# Patient Record
Sex: Female | Born: 1980 | Race: Black or African American | Hispanic: No | Marital: Single | Smoking: Current every day smoker
Health system: Southern US, Community
[De-identification: ages and names within clinical notes are randomized; demographics above are authoritative.]

## PROBLEM LIST (undated history)

## (undated) DIAGNOSIS — E079 Disorder of thyroid, unspecified: Secondary | ICD-10-CM

## (undated) DIAGNOSIS — M549 Dorsalgia, unspecified: Secondary | ICD-10-CM

## (undated) DIAGNOSIS — F329 Major depressive disorder, single episode, unspecified: Secondary | ICD-10-CM

## (undated) DIAGNOSIS — R87629 Unspecified abnormal cytological findings in specimens from vagina: Secondary | ICD-10-CM

## (undated) DIAGNOSIS — F32A Depression, unspecified: Secondary | ICD-10-CM

## (undated) DIAGNOSIS — D649 Anemia, unspecified: Secondary | ICD-10-CM

## (undated) DIAGNOSIS — N83209 Unspecified ovarian cyst, unspecified side: Secondary | ICD-10-CM

## (undated) DIAGNOSIS — F419 Anxiety disorder, unspecified: Secondary | ICD-10-CM

## (undated) HISTORY — DX: Unspecified abnormal cytological findings in specimens from vagina: R87.629

## (undated) HISTORY — DX: Disorder of thyroid, unspecified: E07.9

## (undated) HISTORY — DX: Anemia, unspecified: D64.9

## (undated) HISTORY — DX: Unspecified ovarian cyst, unspecified side: N83.209

## (undated) HISTORY — DX: Major depressive disorder, single episode, unspecified: F32.9

## (undated) HISTORY — DX: Depression, unspecified: F32.A

## (undated) HISTORY — PX: EXTERNAL EAR SURGERY: SHX627

## (undated) HISTORY — DX: Dorsalgia, unspecified: M54.9

## (undated) HISTORY — DX: Anxiety disorder, unspecified: F41.9

---

## 2008-09-03 ENCOUNTER — Ambulatory Visit (HOSPITAL_COMMUNITY): Admission: RE | Admit: 2008-09-03 | Discharge: 2008-09-03 | Payer: Self-pay | Admitting: Obstetrics

## 2008-12-08 ENCOUNTER — Ambulatory Visit (HOSPITAL_COMMUNITY): Admission: RE | Admit: 2008-12-08 | Discharge: 2008-12-08 | Payer: Self-pay | Admitting: Obstetrics

## 2009-01-31 ENCOUNTER — Inpatient Hospital Stay (HOSPITAL_COMMUNITY): Admission: RE | Admit: 2009-01-31 | Discharge: 2009-02-02 | Payer: Self-pay | Admitting: Obstetrics

## 2009-10-18 ENCOUNTER — Emergency Department (HOSPITAL_COMMUNITY): Admission: EM | Admit: 2009-10-18 | Discharge: 2009-10-18 | Payer: Self-pay | Admitting: Emergency Medicine

## 2010-04-09 LAB — CBC
HCT: 28.3 % — ABNORMAL LOW (ref 36.0–46.0)
MCHC: 32.8 g/dL (ref 30.0–36.0)
MCV: 89.8 fL (ref 78.0–100.0)
Platelets: 423 10*3/uL — ABNORMAL HIGH (ref 150–400)
Platelets: 461 10*3/uL — ABNORMAL HIGH (ref 150–400)
RBC: 3.46 MIL/uL — ABNORMAL LOW (ref 3.87–5.11)
RDW: 15.2 % (ref 11.5–15.5)
RDW: 15.3 % (ref 11.5–15.5)
WBC: 19.4 10*3/uL — ABNORMAL HIGH (ref 4.0–10.5)

## 2010-04-09 LAB — RPR: RPR Ser Ql: NONREACTIVE

## 2011-05-21 ENCOUNTER — Other Ambulatory Visit (HOSPITAL_COMMUNITY)
Admission: RE | Admit: 2011-05-21 | Discharge: 2011-05-21 | Disposition: A | Discharge status: Home / Self Care | Payer: Self-pay | Source: Ambulatory Visit | Attending: Family Medicine | Admitting: Family Medicine

## 2011-05-21 DIAGNOSIS — Z01419 Encounter for gynecological examination (general) (routine) without abnormal findings: Secondary | ICD-10-CM | POA: Insufficient documentation

## 2011-08-09 ENCOUNTER — Other Ambulatory Visit: Payer: Self-pay | Admitting: Family Medicine

## 2011-08-09 DIAGNOSIS — E041 Nontoxic single thyroid nodule: Secondary | ICD-10-CM

## 2011-08-09 DIAGNOSIS — E010 Iodine-deficiency related diffuse (endemic) goiter: Secondary | ICD-10-CM

## 2011-08-10 ENCOUNTER — Other Ambulatory Visit: Payer: Self-pay | Admitting: Family Medicine

## 2011-08-13 ENCOUNTER — Other Ambulatory Visit: Payer: Self-pay

## 2011-08-13 ENCOUNTER — Other Ambulatory Visit: Payer: Self-pay | Admitting: Family Medicine

## 2011-08-13 DIAGNOSIS — N6452 Nipple discharge: Secondary | ICD-10-CM

## 2011-08-14 ENCOUNTER — Ambulatory Visit
Admission: RE | Admit: 2011-08-14 | Discharge: 2011-08-14 | Disposition: A | Discharge status: Home / Self Care | Payer: 59 | Source: Ambulatory Visit | Attending: Family Medicine | Admitting: Family Medicine

## 2011-08-14 DIAGNOSIS — E010 Iodine-deficiency related diffuse (endemic) goiter: Secondary | ICD-10-CM

## 2011-08-14 DIAGNOSIS — E041 Nontoxic single thyroid nodule: Secondary | ICD-10-CM

## 2011-08-21 ENCOUNTER — Other Ambulatory Visit: Payer: Self-pay | Admitting: Family Medicine

## 2011-08-21 ENCOUNTER — Ambulatory Visit
Admission: RE | Admit: 2011-08-21 | Discharge: 2011-08-21 | Disposition: A | Discharge status: Home / Self Care | Payer: 59 | Source: Ambulatory Visit | Attending: Family Medicine | Admitting: Family Medicine

## 2011-08-21 DIAGNOSIS — N6452 Nipple discharge: Secondary | ICD-10-CM

## 2012-11-07 ENCOUNTER — Other Ambulatory Visit: Payer: Self-pay | Admitting: Family Medicine

## 2012-11-07 DIAGNOSIS — E010 Iodine-deficiency related diffuse (endemic) goiter: Secondary | ICD-10-CM

## 2012-11-17 ENCOUNTER — Other Ambulatory Visit: Payer: 59

## 2012-11-25 ENCOUNTER — Other Ambulatory Visit: Payer: 59

## 2012-12-24 IMAGING — MG MM DIGITAL DIAGNOSTIC BILAT
4 series · 4 of 4 positions shown · non-contrast
Comparison: None.

CLINICAL DATA: Right nipple discharge from multiple ducts, both
spontaneous and expressed, described as clear to milky.  Baseline.

DIGITAL DIAGNOSTIC BILATERAL MAMMOGRAM WITH CAD

[R CC]
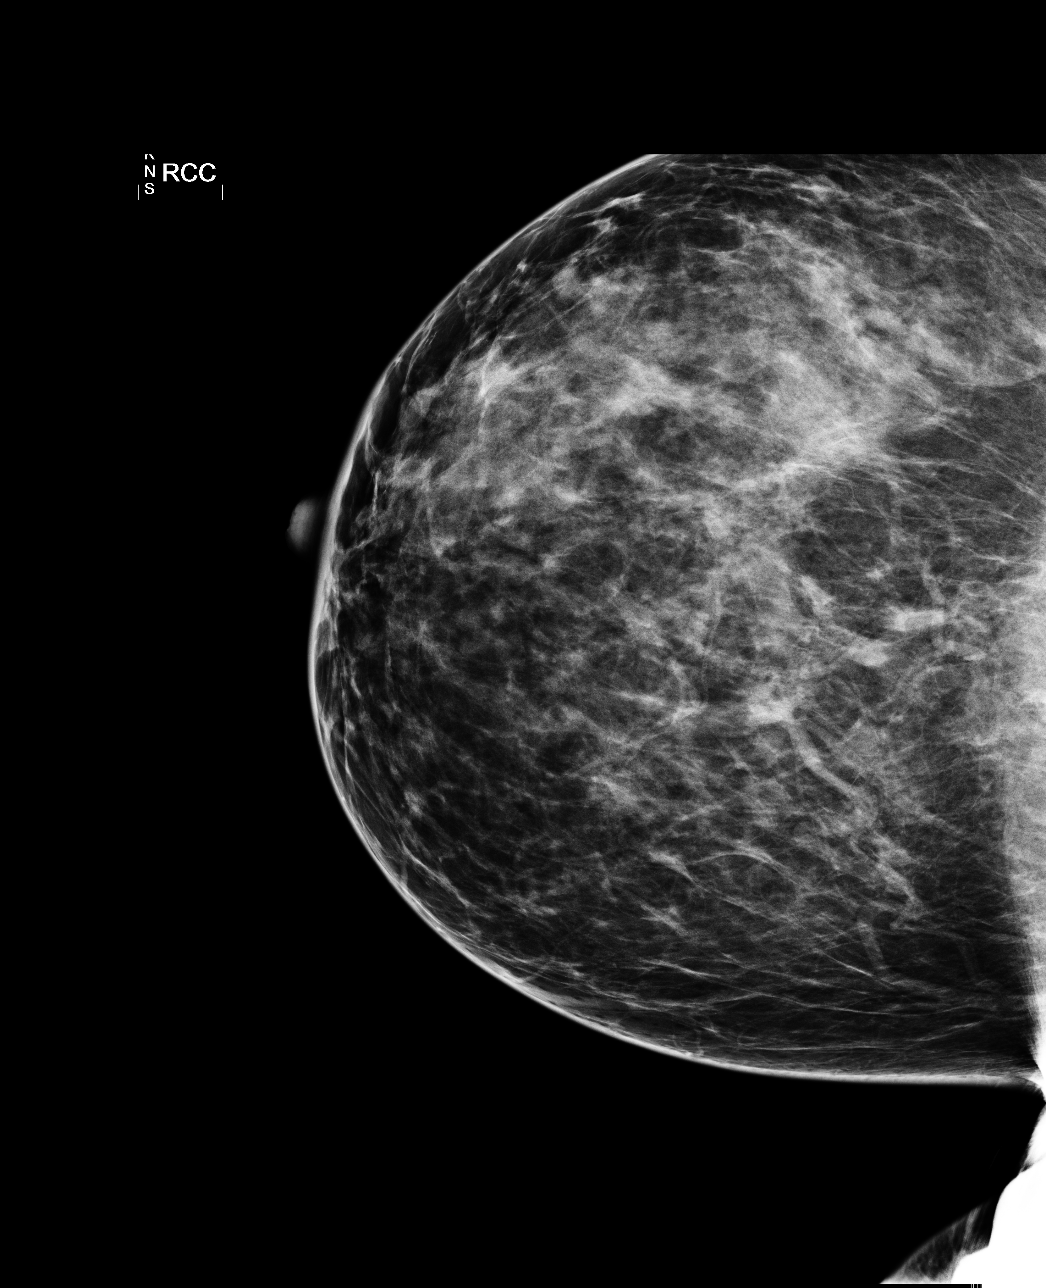

[L CC]
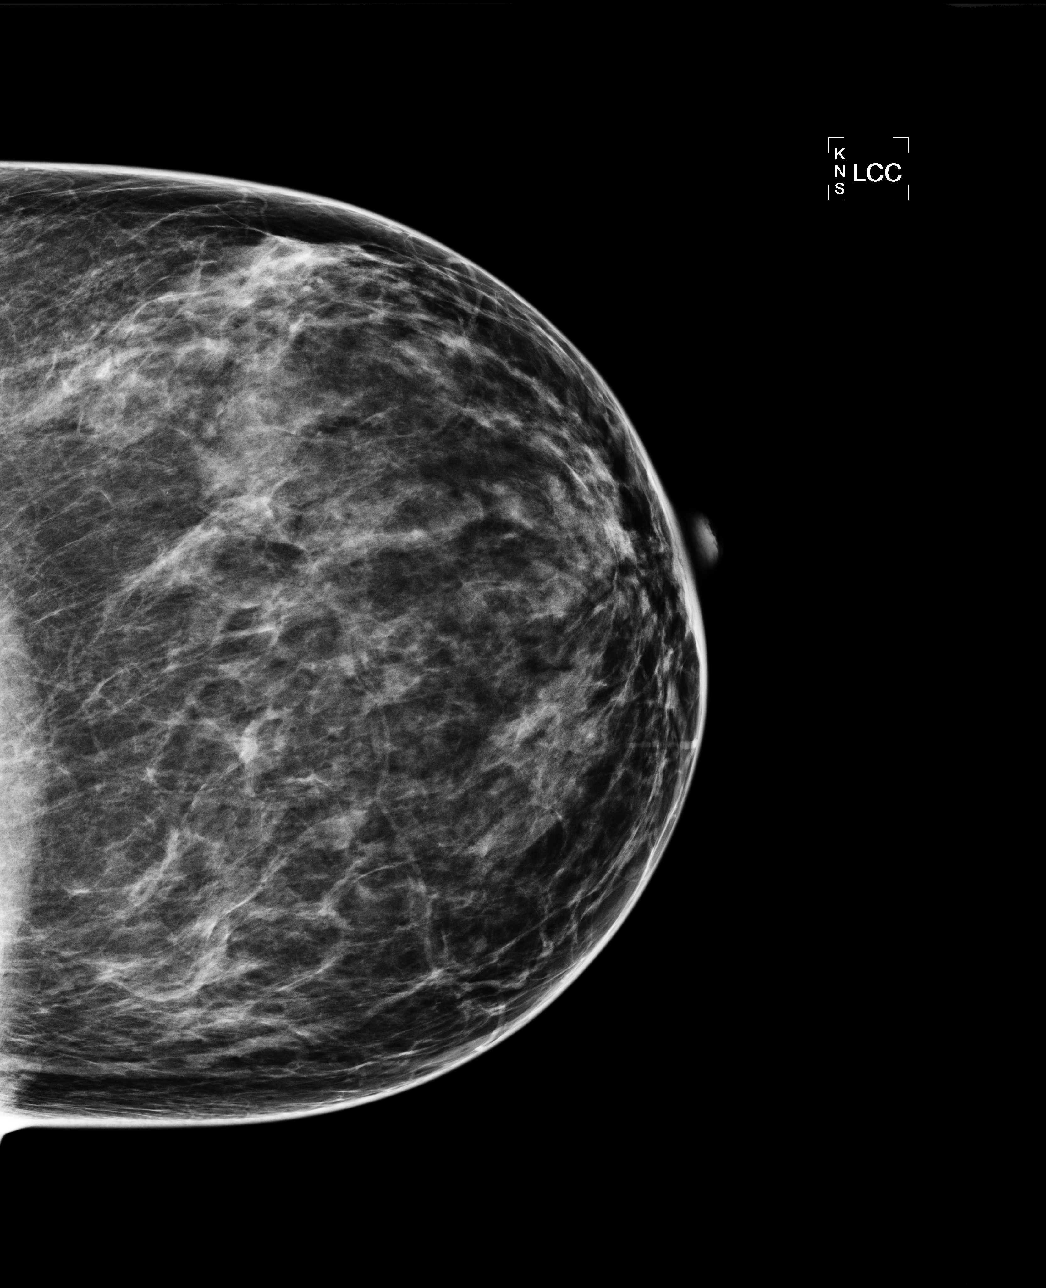

[L MLO]
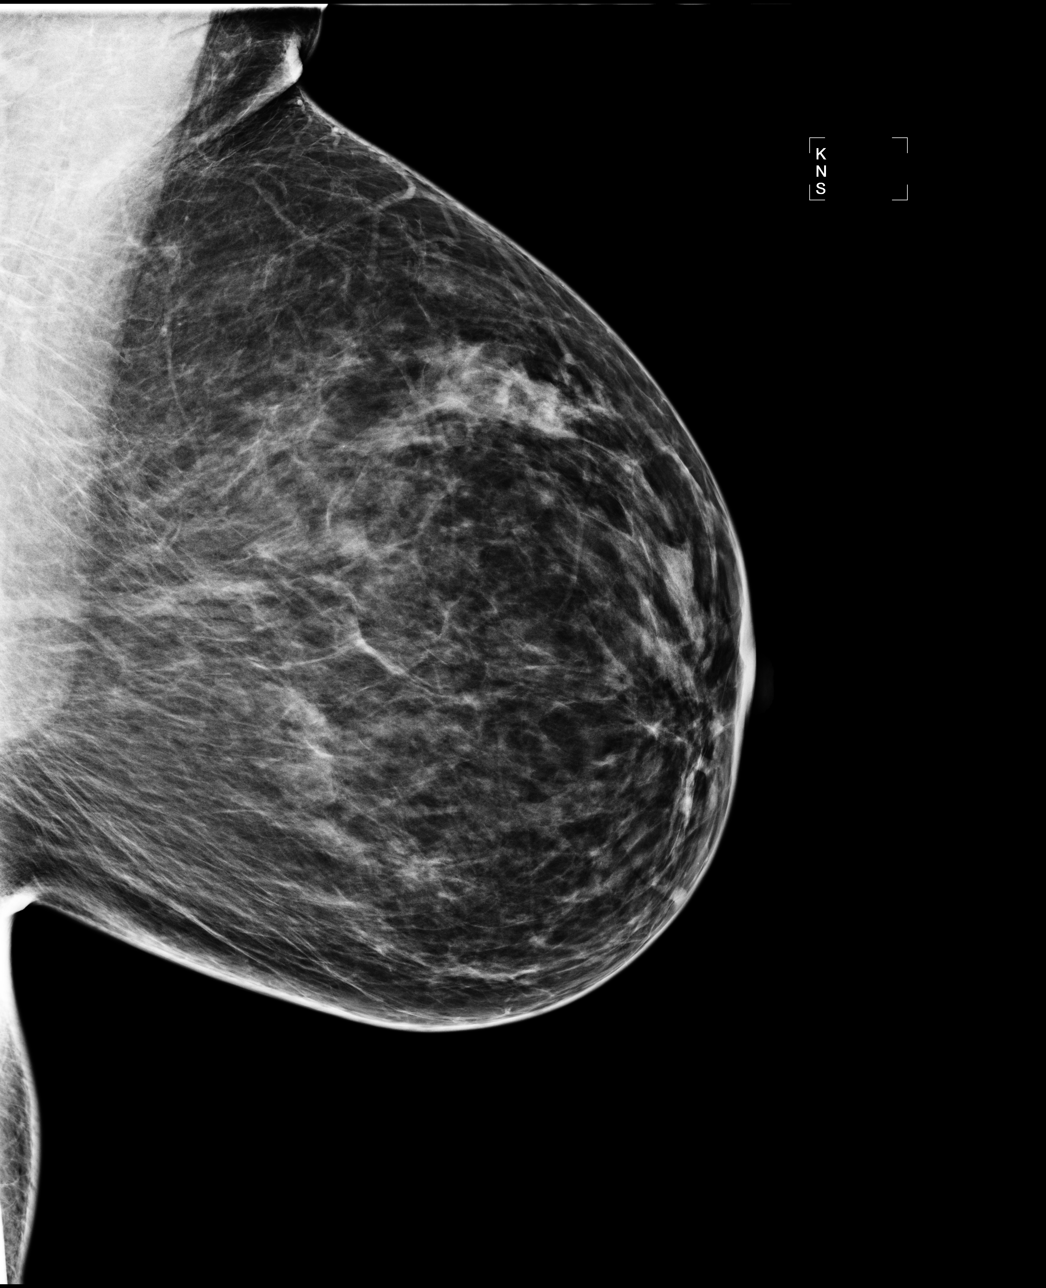

[R MLO]
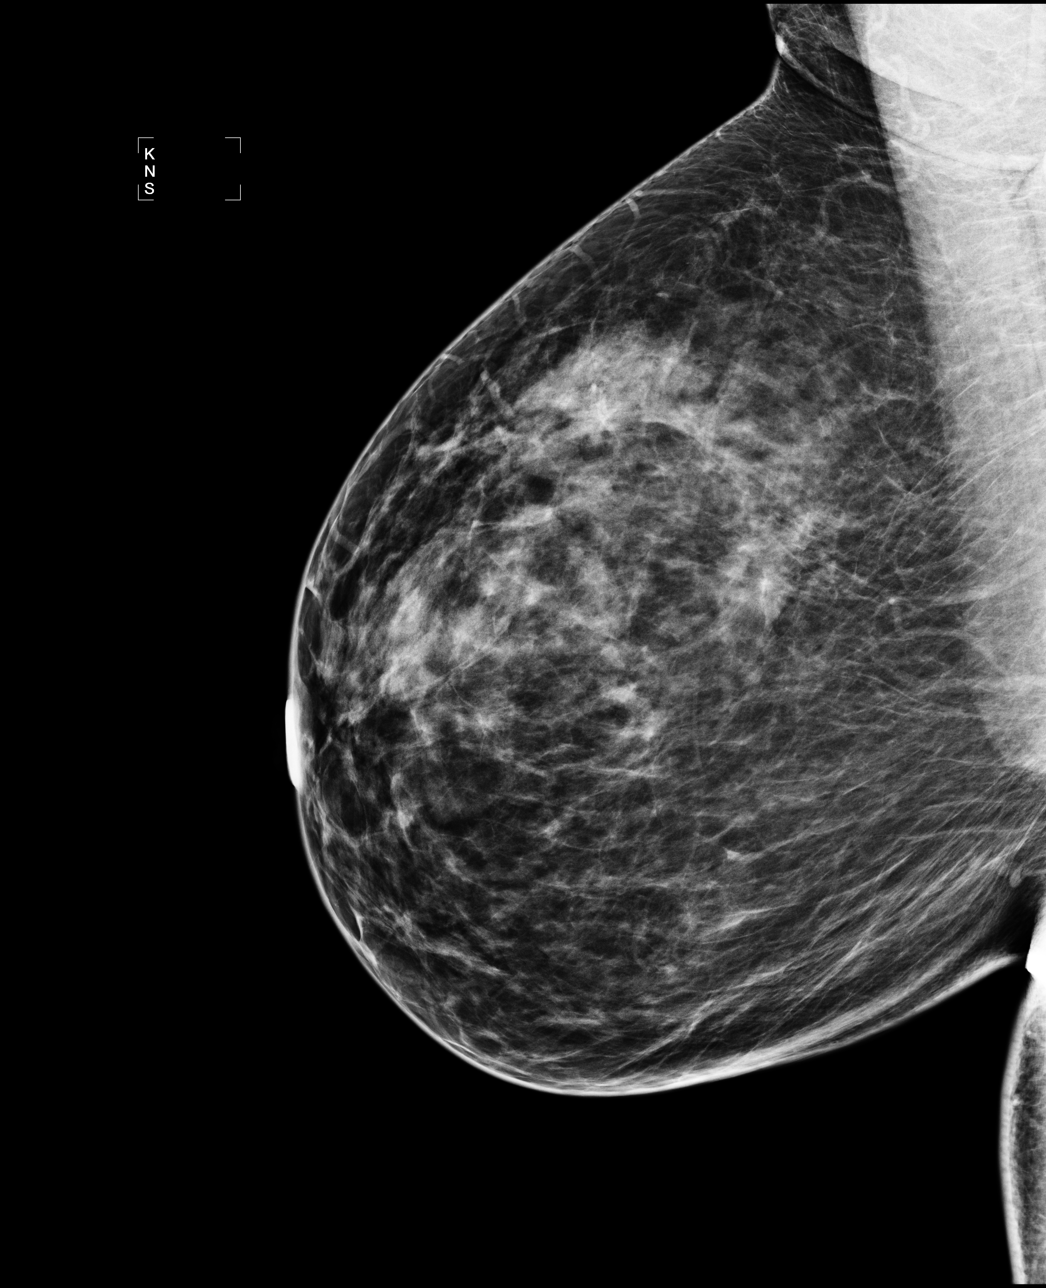

[4 of 4 positions shown; findings below may reference images not displayed]

FINDINGS: CC and MLO views of both breasts were obtained.
Scattered fibroglandular parenchymal pattern, with asymmetric
increased tissue on the right.  No suspicious mass, nonsurgical
architectural distortion, or suspicious calcifications in either
breast.
Mammographic images were processed with CAD.

I attempted to express the nipple discharge, but was unable to do
so despite multiple attempts.
IMPRESSION: No mammographic evidence of malignancy.

RECOMMENDATION:
As I was unable to express the nipple discharge, a ductogram was
not performed.  The patient was informed to return to the [REDACTED] should the discharge recur, if it is only from a single
duct.  (Nipple discharge from multiple ducts is typically benign
and does not require further evaluation)

Screening mammogram at age 39. (Code:[id number])

BI-RADS CATEGORY 1:  Negative.

## 2013-06-03 ENCOUNTER — Encounter: Payer: Self-pay | Admitting: Obstetrics & Gynecology

## 2013-06-11 ENCOUNTER — Encounter: Payer: Self-pay | Admitting: Obstetrics & Gynecology

## 2013-06-11 ENCOUNTER — Ambulatory Visit (INDEPENDENT_AMBULATORY_CARE_PROVIDER_SITE_OTHER): Payer: 59 | Admitting: Obstetrics & Gynecology

## 2013-06-11 VITALS — BP 118/80 | HR 108 | Temp 98.9°F | Ht 62.75 in | Wt 197.0 lb

## 2013-06-11 DIAGNOSIS — R102 Pelvic and perineal pain: Secondary | ICD-10-CM

## 2013-06-11 DIAGNOSIS — N949 Unspecified condition associated with female genital organs and menstrual cycle: Secondary | ICD-10-CM

## 2013-06-11 DIAGNOSIS — Z3202 Encounter for pregnancy test, result negative: Secondary | ICD-10-CM

## 2013-06-11 LAB — POCT URINE PREGNANCY: Preg Test, Ur: NEGATIVE

## 2013-06-11 NOTE — Progress Notes (Signed)
Melinda Lawrence is a 33 y.o.who presents for evaluation of abdominal pain. The pain is described as aching and sharp, and is 8/10 in intensity. Pain is located in the suprapubic, RLQ, LLQ, lumbar and sacral area without radiation. Onset was intermittent occurring 1 month ago. Symptoms have been gradually worsening since. Aggravating factors: activity. Alleviating factors: resting. Associated symptoms: dysuria and tenesmus. The patient denies dyspareunia. Risk factors for pelvic/abdominal pain include none. No prior w/u.   Menstrual History: OB History   Grav Para Term Preterm Abortions TAB SAB Ect Mult Living   0 0 0 0 0 0 0 0 0 0        Patient's last menstrual period was 04/27/2013.   There are no active problems to display for this patient.  Past Medical History  Diagnosis Date  . Thyroid disease   . Anemia   . Ovarian cyst     History reviewed. No pertinent past surgical history.  Current outpatient prescriptions:buPROPion (WELLBUTRIN XL) 150 MG 24 hr tablet, , Disp: , Rfl: ;  NUVARING 0.12-0.015 MG/24HR vaginal ring, , Disp: , Rfl:  No Known Allergies  History  Substance Use Topics  . Smoking status: Current Every Day Smoker  . Smokeless tobacco: Never Used  . Alcohol Use: Yes    Family History  Problem Relation Age of Onset  . Cancer Mother   . Diabetes Father        Review of Systems Constitutional: negative for fatigue and weight loss Respiratory: negative for cough and wheezing Cardiovascular: negative for chest pain, fatigue and palpitations Gastrointestinal: negative for abdominal pain and change in bowel habits Genitourinary:negative for dyspareunia Integument/breast: negative for nipple discharge Musculoskeletal:negative for myalgias Neurological: negative for gait problems and tremors Behavioral/Psych: negative for abusive relationship, depression Endocrine: negative for temperature intolerance        Objective:    BP 116/74  Pulse 68  Temp(Src)  97.8 F (36.6 C) (Oral)  Ht 5\' 5"  (1.651 m)  Wt 74.39 kg (164 lb)  BMI 27.29 kg/m2  LMP 04/27/2013 General:   alert  Skin:   no rash or abnormalities  Lungs:   clear to auscultation bilaterally  Heart:   regular rate and rhythm, S1, S2 normal, no murmur, click, rub or gallop  Abdomen:  normal findings: no organomegaly, soft, non-tender and no hernia  Pelvis:  External genitalia: normal general appearance Urinary system: urethral meatus normal and bladder without fullness, nontender Vaginal: normal without tenderness, induration or masses Cervix: normal appearance Adnexa: normal bimanual exam Uterus: anteverted and non-tender, normal size Levator tenderness/tone: none/normal Straight leg raising test: negative   Lab Review Labs: Urine pregnancy test   Imaging None   Pelvic pain assessment form reviewed      Assessment:    Chronic pelvic pain syndrome--?etiology; ?endometriosis    Plan:    counseled re: diagnostic laparoscopy    Orders Placed This Encounter  Procedures  . US Pelvis Complete    Standing Status: Future     Number of Occurrences:      Standing Expiration Date: 08/12/2014    Order Specific Question:  Reason for Exam (SYMPTOM  OR DIAGNOSIS REQUIRED)    Answer:  Pelvic Pain    Order Specific Question:  Preferred imaging location?    Answer:  Internal  . HIV antibody  . RPR  . POCT urine pregnancy   Meds ordered this encounter  Medications  . buPROPion (WELLBUTRIN XL) 150 MG 24 hr tablet    Sig:   .  NUVARING 0.12-0.015 MG/24HR vaginal ring    Sig:    Follow up as needed.

## 2013-06-12 ENCOUNTER — Other Ambulatory Visit: Payer: Self-pay | Admitting: Obstetrics & Gynecology

## 2013-06-12 DIAGNOSIS — R102 Pelvic and perineal pain: Secondary | ICD-10-CM

## 2013-06-12 LAB — RPR

## 2013-06-12 LAB — HIV ANTIBODY (ROUTINE TESTING W REFLEX): HIV 1&2 Ab, 4th Generation: NONREACTIVE

## 2013-06-13 NOTE — Patient Instructions (Signed)
Pelvic Pain, Female °Female pelvic pain can be caused by many different things and start from a variety of places. Pelvic pain refers to pain that is located in the lower half of the abdomen and between your hips. The pain may occur over a short period of time (acute) or may be reoccurring (chronic). The cause of pelvic pain may be related to disorders affecting the female reproductive organs (gynecologic), but it may also be related to the bladder, kidney stones, an intestinal complication, or muscle or skeletal problems. Getting help right away for pelvic pain is important, especially if there has been severe, sharp, or a sudden onset of unusual pain. It is also important to get help right away because some types of pelvic pain can be life threatening.  °CAUSES  °Below are only some of the causes of pelvic pain. The causes of pelvic pain can be in one of several categories.  °· Gynecologic. °· Pelvic inflammatory disease. °· Sexually transmitted infection. °· Ovarian cyst or a twisted ovarian ligament (ovarian torsion). °· Uterine lining that grows outside the uterus (endometriosis). °· Fibroids, cysts, or tumors. °· Ovulation. °· Pregnancy. °· Pregnancy that occurs outside the uterus (ectopic pregnancy). °· Miscarriage. °· Labor. °· Abruption of the placenta or ruptured uterus. °· Infection. °· Uterine infection (endometritis). °· Bladder infection. °· Diverticulitis. °· Miscarriage related to a uterine infection (septic abortion). °· Bladder. °· Inflammation of the bladder (cystitis). °· Kidney stone(s). °· Gastrointenstinal. °· Constipation. °· Diverticulitis. °· Neurologic. °· Trauma. °· Feeling pelvic pain because of mental or emotional causes (psychosomatic). °· Cancers of the bowel or pelvis. °EVALUATION  °Your caregiver will want to take a careful history of your concerns. This includes recent changes in your health, a careful gynecologic history of your periods (menses), and a sexual history. Obtaining  your family history and medical history is also important. Your caregiver may suggest a pelvic exam. A pelvic exam will help identify the location and severity of the pain. It also helps in the evaluation of which organ system may be involved. In order to identify the cause of the pelvic pain and be properly treated, your caregiver may order tests. These tests may include:  °· A pregnancy test. °· Pelvic ultrasonography. °· An X-ray exam of the abdomen. °· A urinalysis or evaluation of vaginal discharge. °· Blood tests. °HOME CARE INSTRUCTIONS  °· Only take over-the-counter or prescription medicines for pain, discomfort, or fever as directed by your caregiver.   °· Rest as directed by your caregiver.   °· Eat a balanced diet.   °· Drink enough fluids to make your urine clear or pale yellow, or as directed.   °· Avoid sexual intercourse if it causes pain.   °· Apply warm or cold compresses to the lower abdomen depending on which one helps the pain.   °· Avoid stressful situations.   °· Keep a journal of your pelvic pain. Write down when it started, where the pain is located, and if there are things that seem to be associated with the pain, such as food or your menstrual cycle. °· Follow up with your caregiver as directed.   °SEEK MEDICAL CARE IF: °· Your medicine does not help your pain. °· You have abnormal vaginal discharge. °SEEK IMMEDIATE MEDICAL CARE IF:  °· You have heavy bleeding from the vagina.   °· Your pelvic pain increases.   °· You feel lightheaded or faint.   °· You have chills.   °· You have pain with urination or blood in your urine.   °· You have uncontrolled   diarrhea or vomiting.   °· You have a fever or persistent symptoms for more than 3 days. °· You have a fever and your symptoms suddenly get worse.   °· You are being physically or sexually abused.   °MAKE SURE YOU: °· Understand these instructions. °· Will watch your condition. °· Will get help if you are not doing well or get worse. °Document  Released: 12/06/2003 Document Revised: 07/10/2011 Document Reviewed: 04/30/2011 °ExitCare® Patient Information ©2014 [employer]. ° °

## 2013-06-16 ENCOUNTER — Other Ambulatory Visit: Payer: 59

## 2013-06-17 LAB — PAP IG, CT-NG NAA, HPV HIGH-RISK
Chlamydia Probe Amp: NEGATIVE
GC Probe Amp: NEGATIVE
HPV DNA High Risk: DETECTED — AB

## 2013-06-19 ENCOUNTER — Encounter: Payer: Self-pay | Admitting: Obstetrics & Gynecology

## 2013-06-19 DIAGNOSIS — R87612 Low grade squamous intraepithelial lesion on cytologic smear of cervix (LGSIL): Secondary | ICD-10-CM | POA: Insufficient documentation

## 2013-06-24 ENCOUNTER — Encounter: Payer: Self-pay | Admitting: *Deleted

## 2013-06-30 ENCOUNTER — Ambulatory Visit (INDEPENDENT_AMBULATORY_CARE_PROVIDER_SITE_OTHER): Payer: 59

## 2013-06-30 ENCOUNTER — Other Ambulatory Visit: Payer: 59

## 2013-06-30 DIAGNOSIS — R102 Pelvic and perineal pain: Secondary | ICD-10-CM

## 2013-06-30 DIAGNOSIS — N949 Unspecified condition associated with female genital organs and menstrual cycle: Secondary | ICD-10-CM

## 2013-07-16 ENCOUNTER — Ambulatory Visit: Payer: 59 | Admitting: Obstetrics & Gynecology

## 2013-07-21 ENCOUNTER — Telehealth: Payer: Self-pay | Admitting: *Deleted

## 2013-07-21 NOTE — Telephone Encounter (Signed)
Pt called in to office regarding her u/s results.  Pt had a f/u appt but could not keep due to balance on acct not paid. Pt would like to know what u/s showed.  Pt to be notified Dr Tamela OddiJackson Moore will review and advise of any need for f/u.  Please review u/s and advise.

## 2013-07-22 NOTE — Telephone Encounter (Signed)
2 cm fibroid--not likely source of pain. ?diagnostic laparoscopy.  Does she know about LSIL Pap--needs colposcopy.

## 2013-07-29 ENCOUNTER — Encounter: Payer: Self-pay | Admitting: Obstetrics & Gynecology

## 2013-11-09 ENCOUNTER — Other Ambulatory Visit: Payer: Self-pay | Admitting: Family Medicine

## 2013-11-23 ENCOUNTER — Encounter: Payer: Self-pay | Admitting: Obstetrics & Gynecology

## 2014-01-18 ENCOUNTER — Encounter: Payer: Self-pay | Admitting: *Deleted

## 2014-01-19 ENCOUNTER — Encounter: Payer: Self-pay | Admitting: Obstetrics & Gynecology

## 2014-03-01 ENCOUNTER — Encounter (HOSPITAL_COMMUNITY): Payer: Self-pay

## 2014-03-01 ENCOUNTER — Other Ambulatory Visit (HOSPITAL_COMMUNITY): Payer: PRIVATE HEALTH INSURANCE | Attending: Psychiatry | Admitting: Psychiatry

## 2014-03-01 DIAGNOSIS — F411 Generalized anxiety disorder: Secondary | ICD-10-CM | POA: Insufficient documentation

## 2014-03-01 DIAGNOSIS — F4310 Post-traumatic stress disorder, unspecified: Secondary | ICD-10-CM

## 2014-03-01 DIAGNOSIS — F410 Panic disorder [episodic paroxysmal anxiety] without agoraphobia: Secondary | ICD-10-CM | POA: Diagnosis not present

## 2014-03-01 DIAGNOSIS — F331 Major depressive disorder, recurrent, moderate: Secondary | ICD-10-CM | POA: Diagnosis present

## 2014-03-01 DIAGNOSIS — G4700 Insomnia, unspecified: Secondary | ICD-10-CM | POA: Diagnosis not present
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
## 2014-03-02 ENCOUNTER — Encounter (HOSPITAL_COMMUNITY): Payer: Self-pay | Admitting: Psychiatry

## 2014-03-02 ENCOUNTER — Other Ambulatory Visit (HOSPITAL_COMMUNITY): Payer: PRIVATE HEALTH INSURANCE | Admitting: Psychiatry

## 2014-03-02 DIAGNOSIS — F331 Major depressive disorder, recurrent, moderate: Secondary | ICD-10-CM | POA: Diagnosis not present

## 2014-03-02 DIAGNOSIS — F339 Major depressive disorder, recurrent, unspecified: Secondary | ICD-10-CM | POA: Insufficient documentation
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
## 2014-03-03 ENCOUNTER — Other Ambulatory Visit (HOSPITAL_COMMUNITY): Payer: PRIVATE HEALTH INSURANCE | Admitting: Psychiatry

## 2014-03-03 DIAGNOSIS — F331 Major depressive disorder, recurrent, moderate: Secondary | ICD-10-CM

## 2014-03-03 NOTE — Progress Notes (Signed)
    Daily Group Progress Note  Program: IOP  Group Time: 9:00-10:30  Participation Level: Active  Behavioral Response: Appropriate  Type of Therapy:  Group Therapy  Summary of Progress: Pt. Reported that she was feeling "ok". Pt. Reported that it was was not as hard for her to get up today and that she did not feel as challenged by anxiety while driving. Pt. Reported that she did feel "a little sad".     Group Time: 10:30-12:00  Participation Level:  Active  Behavioral Response: Appropriate  Type of Therapy: Psycho-education Group  Summary of Progress: Pt. Participated in progressive muscle relaxation exercise.  Shaune PollackBrown, Maryiah Olvey B, LPC

## 2014-03-03 NOTE — Progress Notes (Signed)
    Daily Group Progress Note  Program: IOP  Group Time: 9:00-10:30  Participation Level: Active  Behavioral Response: Appropriate  Type of Therapy:  Group Therapy  Summary of Progress: Pt. Met with psychiatrist and case manager.     Group Time: 10:30-12:00  Participation Level:  Active  Behavioral Response: Appropriate  Type of Therapy: Psycho-education Group  Summary of Progress: Pt. was active in the group. Pt. learned about symptoms of panic attacks and what causes them to occur, treatment options, and discussed how her parents unintentionally say negative comments about depression and anxiety. Pt. watched a video on what a panic attack can look like and admits to having attacks while driving. Pt. reports not having much support and admits to holding in her feelings.   Nancie Neas, LPC

## 2014-03-03 NOTE — Progress Notes (Signed)
    Daily Group Progress Note  Program: IOP  Group Time: 9:00-10:30  Participation Level: Active  Behavioral Response: Appropriate  Type of Therapy:  Group Therapy  Summary of Progress: Pt. was active in group. Pt. continues to tell the story of her stressor being her children and her job. Pt. reports having panic attacks while driving and is afraid of driving on the highway.      Group Time: 10:30-12:00  Participation Level:  Active  Behavioral Response: Appropriate  Type of Therapy: Psycho-education Group  Summary of Progress: Pt. was active in group. Summary of Progress:  Pt. learned about a personal wellness recovery plan. Alyse talked about quick fixes, mental strategies, physical strategies, outside support system, and admitting unhealthy strategies.   Shaune PollackBrown, Lailana Shira B, LPC

## 2014-03-04 ENCOUNTER — Other Ambulatory Visit (HOSPITAL_COMMUNITY): Payer: PRIVATE HEALTH INSURANCE | Admitting: Psychiatry

## 2014-03-04 DIAGNOSIS — F331 Major depressive disorder, recurrent, moderate: Secondary | ICD-10-CM | POA: Diagnosis not present

## 2014-03-04 NOTE — Progress Notes (Signed)
    Daily Group Progress Note  Program: IOP  Group Time: 9:00-10:30  Participation Level: Active  Behavioral Response: Appropriate  Type of Therapy:  Group Therapy  Summary of Progress: Pt. Was talkative and tearful as she discussed family dynamics, history of poor communication and lack of communication from parents, and stress of being an single parent. Pt. 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      Group Time: 10:30-12:00  Participation Level:  Active  Behavioral Response: Appropriate  Type of Therapy: Group Therapy  Summary of Progress: Pt was alert and attentive, acknowledged patterns of all or nothing thinking and perfectionism that impede progress towards goals.   Shaune PollackBrown, Jennifer B, LPC

## 2014-03-05 ENCOUNTER — Other Ambulatory Visit (HOSPITAL_COMMUNITY): Payer: PRIVATE HEALTH INSURANCE | Admitting: Psychiatry

## 2014-03-05 DIAGNOSIS — F331 Major depressive disorder, recurrent, moderate: Secondary | ICD-10-CM | POA: Diagnosis not present

## 2014-03-05 NOTE — Progress Notes (Signed)
Ripley Fraisedrienne B Lindquist is a 34 y.o., single, employed, [place] female, who was a self referral; treatment for depressive and anxiety symptoms.  Pt started [facility]-IOP on 03-01-14.  Has been attending daily and is very active in all the groups.  Pt has been authorized to attend MH-IOP until Friday 03-19-14.  A:  Will schedule appointments for patient to see a therapist and a psychiatrist within the first week of March.  R:  Pt receptive.
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
## 2014-03-08 ENCOUNTER — Other Ambulatory Visit (HOSPITAL_COMMUNITY): Payer: PRIVATE HEALTH INSURANCE

## 2014-03-09 ENCOUNTER — Other Ambulatory Visit (HOSPITAL_COMMUNITY): Payer: PRIVATE HEALTH INSURANCE | Admitting: Psychiatry

## 2014-03-09 DIAGNOSIS — F331 Major depressive disorder, recurrent, moderate: Secondary | ICD-10-CM

## 2014-03-10 ENCOUNTER — Other Ambulatory Visit (HOSPITAL_COMMUNITY): Payer: PRIVATE HEALTH INSURANCE | Admitting: Psychiatry

## 2014-03-10 DIAGNOSIS — F331 Major depressive disorder, recurrent, moderate: Secondary | ICD-10-CM

## 2014-03-10 NOTE — Progress Notes (Signed)
    Daily Group Progress Note  Program: IOP  Group Time: 9:00-10:30  Participation Level: Active  Behavioral Response: Appropriate  Type of Therapy:  Group Therapy  Summary of Progress: Pt. Reported that she continues to struggle with insomnia. Pt. Also reported low motivation for everyday activities. Pt. Continues to process loss related to relationships with her mother and father which have not been validating or her feelings or nurturing.      Group Time: 10:30-12:00  Participation Level:  Active  Behavioral Response: Appropriate  Type of Therapy: Psycho-education Group  Summary of Progress: Pt. Participated in guided meditation "magical color shower" exercise.   Shaune PollackBrown, Jennifer B, LPC

## 2014-03-11 ENCOUNTER — Other Ambulatory Visit (HOSPITAL_COMMUNITY): Payer: PRIVATE HEALTH INSURANCE | Admitting: Psychiatry

## 2014-03-11 ENCOUNTER — Telehealth (HOSPITAL_COMMUNITY): Payer: Self-pay | Admitting: Psychiatry

## 2014-03-11 NOTE — Progress Notes (Signed)
    Daily Group Progress Note  Program: IOP  Group Time: 9:00-10:30  Participation Level: Active  Behavioral Response: Appropriate  Type of Therapy:  Group Therapy  Summary of Progress: Pt. Was active in group. Pt appeared sad today, reported some car problems and belief that she will need to purchase a new car.     Group Time: 10:30-12:00  Participation Level:  Active  Behavioral Response: Appropriate  Type of Therapy: Psycho-education Group  Summary of Progress: Pt was active in group. Pt learned about the importance of self-care, self-care activities, and the importance of saying no to things that bring negativity and cause burnout. Reports listening to the rain as a self-care activity.  Shaune PollackBrown, Shaleena Crusoe B, LPC

## 2014-03-12 ENCOUNTER — Other Ambulatory Visit (HOSPITAL_COMMUNITY): Payer: PRIVATE HEALTH INSURANCE | Admitting: Psychiatry

## 2014-03-12 DIAGNOSIS — F331 Major depressive disorder, recurrent, moderate: Secondary | ICD-10-CM | POA: Diagnosis not present

## 2014-03-12 NOTE — Progress Notes (Signed)
    Daily Group Progress Note  Program: IOP  Group Time: 9:00-10:30  Participation Level: Active  Behavioral Response: Appropriate  Type of Therapy:  Group Therapy  Summary of Progress: Pt. Reported that she was "ok". Pt. Presented with brightened affect, made good eye contact, provided feedback to members of the group.     Group Time:  10:30-12:00  Participation Level:  Active  Behavioral Response: Appropriate  Type of Therapy: Psycho-education Group  Summary of Progress: Pt. Participated in grief and loss facilitated by Theda BelfastBob Hamilton.   Shaune PollackBrown, Jennifer B, LPC

## 2014-03-12 NOTE — Telephone Encounter (Signed)
Pt apparently had a difficult time in grief/loss group today.  According to [facility] Public affairs consultant(intern) pt had a severe panic attack while participating in a meditation exercise.  A:  Placed call to pt to check on her.  Left vm for pt to call writer back.

## 2014-03-15 ENCOUNTER — Other Ambulatory Visit (HOSPITAL_COMMUNITY): Payer: PRIVATE HEALTH INSURANCE | Admitting: Psychiatry

## 2014-03-15 DIAGNOSIS — F331 Major depressive disorder, recurrent, moderate: Secondary | ICD-10-CM

## 2014-03-15 NOTE — Progress Notes (Signed)
Melinda Lawrence is a 34 y.o, single, employed, [place] American female patient who was admitted on 03-01-14 in [place].  This Probation officer met with the patient today to follow up with how she was doing from her panic attack in group on Friday 03/12/14. Pt. states that she is doing better and was able to get some rest. Pt. Continues to report her lack of support and states she feels alone. A: Will refer her to [facility] for additional support groups once discharged from [place]. R: Patient receptive.     [facility] Intern  [place]

## 2014-03-16 ENCOUNTER — Other Ambulatory Visit (HOSPITAL_COMMUNITY): Payer: PRIVATE HEALTH INSURANCE | Admitting: Psychiatry

## 2014-03-16 DIAGNOSIS — F331 Major depressive disorder, recurrent, moderate: Secondary | ICD-10-CM

## 2014-03-17 ENCOUNTER — Other Ambulatory Visit (HOSPITAL_COMMUNITY): Payer: PRIVATE HEALTH INSURANCE | Admitting: Psychiatry

## 2014-03-17 DIAGNOSIS — F331 Major depressive disorder, recurrent, moderate: Secondary | ICD-10-CM | POA: Diagnosis not present

## 2014-03-18 ENCOUNTER — Other Ambulatory Visit (HOSPITAL_COMMUNITY): Payer: PRIVATE HEALTH INSURANCE | Admitting: Psychiatry

## 2014-03-18 DIAGNOSIS — F331 Major depressive disorder, recurrent, moderate: Secondary | ICD-10-CM

## 2014-03-18 NOTE — Progress Notes (Signed)
    Daily Group Progress Note  Program: IOP  Group Time: 9:00-10:30  Participation Level: Active  Behavioral Response: Appropriate  Type of Therapy:  Group Therapy  Summary of Progress: Pt. Was active in group.  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     Group Time: 10:30-12:00  Participation Level:  Active  Behavioral Response: Appropriate  Type of Therapy: Psycho-education Group  Summary of Progress: Pt was active in group. Pt. watched Marjo BickerKevin Breel TED Talk and discussed the stigma around depression and anxiety. Pt continues to discuss her parents not understanding her illness.   Shaune PollackBrown, Toua Stites B, LPC

## 2014-03-18 NOTE — Progress Notes (Signed)
    Daily Group Progress Note  Program: IOP  Group Time: 9:00-10:30  Participation Level: Active  Behavioral Response: Appropriate  Type of Therapy:  Psycho-education Group  Summary of Progress: Pt. Participated in medication management group with Michelle NasutiElena.      Group Time: 10:30-12:00  Participation Level:  Active  Behavioral Response: Appropriate  Type of Therapy: Group Therapy  Summary of Progress: Pt. Participated in discussion about developing motivation. Pt. Watched Mel Robbins video and set 5 second challenge to practice her piano.  Shaune PollackBrown, Jennifer B, LPC

## 2014-03-18 NOTE — Progress Notes (Signed)
    Daily Group Progress Note  Program: IOP  Group Time: 9:00-10:30  Participation Level: Active  Behavioral Response: Appropriate  Type of Therapy:  Group Therapy  Summary of Progress: Pt. Presented as alert and attentive, intermittently tearful. Pt. Reported that she continues to be challenged by low motivation, and grief regarding poor relationship with mother and family and generally not feeling supported.     Group Time: 10:30-12:00  Participation Level:  Active  Behavioral Response: Appropriate  Type of Therapy: Psycho-education Group  Summary of Progress: Pt. Participated in discussion about developing healthy interpersonal and intrapersonal boundaries.   Shaune PollackBrown, Jennifer B, LPC

## 2014-03-18 NOTE — Progress Notes (Signed)
  [facility]  Melinda Lawrence [id number]  Admission date: 03/01/2014 Discharge date: 03/22/2014  Reason for admission: depression  Chemical Use History: none of relevance  Family of Origin Issues: troubled relationship with her parents  Progress in Program Toward Treatment Goals: Still has all the same issues, but feels some less depressed and better able to cope she says.  Still conflicted relationship with her parents  Progress (rationale): structure, talking out her problems and skills training helped.. She still needs individual therapy to deal with her self esteem and parental conflicts    Benjaman PottAYLOR,Shea Kapur D, MD 03/18/2014

## 2014-03-19 ENCOUNTER — Other Ambulatory Visit (HOSPITAL_COMMUNITY): Payer: PRIVATE HEALTH INSURANCE

## 2014-03-19 NOTE — Progress Notes (Signed)
    Daily Group Progress Note  Program: IOP  Group Time: 9:00-10:30  Participation Level: Active  Behavioral Response: Appropriate  Type of Therapy:  Group Therapy  Summary of Progress: Pt. Reported that she was feeling much better today. Pt. Reported that she was able to sleep, and was able to sleep through the night last night. Pt. Reported that she was planning to get her hair done which makes her feel better.      Group Time: 10:30-12:00  Participation Level:  Active  Behavioral Response: Appropriate  Type of Therapy: Psycho-education Group  Summary of Progress: Pt. Participated in breathing exercise and reflection to music related to acceptance of self-image.  Shaune PollackBrown, Jennifer B, LPC

## 2014-03-22 ENCOUNTER — Other Ambulatory Visit (HOSPITAL_COMMUNITY): Payer: PRIVATE HEALTH INSURANCE | Admitting: Psychiatry

## 2014-03-22 DIAGNOSIS — F331 Major depressive disorder, recurrent, moderate: Secondary | ICD-10-CM
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
## 2014-03-22 NOTE — Patient Instructions (Addendum)
Patient completed MH-IOP today.  Will follow up with Dr. Lolly MustacheArfeen on 03-23-14 @ 1:15 pm and [name], [facility] on 03-25-14 @ 11 am.  Encouraged support groups.

## 2014-03-23 ENCOUNTER — Encounter (HOSPITAL_COMMUNITY): Payer: Self-pay | Admitting: Psychiatry

## 2014-03-23 ENCOUNTER — Other Ambulatory Visit (HOSPITAL_COMMUNITY): Payer: 59

## 2014-03-23 ENCOUNTER — Ambulatory Visit (INDEPENDENT_AMBULATORY_CARE_PROVIDER_SITE_OTHER): Payer: PRIVATE HEALTH INSURANCE | Admitting: Psychiatry

## 2014-03-23 VITALS — BP 141/90 | HR 98 | Ht 62.75 in | Wt 188.4 lb

## 2014-03-23 DIAGNOSIS — F339 Major depressive disorder, recurrent, unspecified: Secondary | ICD-10-CM | POA: Diagnosis not present

## 2014-03-23 DIAGNOSIS — F1010 Alcohol abuse, uncomplicated: Secondary | ICD-10-CM

## 2014-03-23 DIAGNOSIS — F3131 Bipolar disorder, current episode depressed, mild: Secondary | ICD-10-CM

## 2014-03-23 MED ORDER — TRAZODONE HCL 100 MG PO TABS: 100.0000 mg | ORAL_TABLET | Freq: Every day | ORAL | Status: DC

## 2014-03-23 MED ORDER — LAMOTRIGINE 25 MG PO TABS: ORAL_TABLET | ORAL | Status: DC

## 2014-03-23 MED ORDER — BUPROPION HCL ER (XL) 300 MG PO TB24: 300.0000 mg | ORAL_TABLET | Freq: Every day | ORAL | Status: DC
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
## 2014-03-24 ENCOUNTER — Other Ambulatory Visit (HOSPITAL_COMMUNITY): Payer: 59

## 2014-03-24 NOTE — Progress Notes (Signed)
    Daily Group Progress Note  Program: IOP  Group Time: 9:00-10:15  Participation Level: Active  Behavioral Response: Appropriate  Type of Therapy:  Psycho-education Group  Summary of Progress: Pt. Participated in medication management group with Michelle NasutiElena.     Group Time: 10:15-12:00  Participation Level:  Active  Behavioral Response: Appropriate  Type of Therapy: Group Therapy  Summary of Progress: Pt. Reported readiness for discharge. Pt. 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 Pt. 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   Shaune PollackBrown, Alessandra Sawdey B, LPC

## 2014-03-25 ENCOUNTER — Other Ambulatory Visit (HOSPITAL_COMMUNITY): Payer: 59

## 2014-03-25 ENCOUNTER — Ambulatory Visit (INDEPENDENT_AMBULATORY_CARE_PROVIDER_SITE_OTHER): Payer: 59 | Admitting: Psychology

## 2014-03-25 ENCOUNTER — Encounter (HOSPITAL_COMMUNITY): Payer: Self-pay | Admitting: Psychology

## 2014-03-25 DIAGNOSIS — F410 Panic disorder [episodic paroxysmal anxiety] without agoraphobia: Secondary | ICD-10-CM | POA: Insufficient documentation

## 2014-03-25 DIAGNOSIS — F331 Major depressive disorder, recurrent, moderate: Secondary | ICD-10-CM

## 2014-03-26 ENCOUNTER — Other Ambulatory Visit (HOSPITAL_COMMUNITY): Payer: 59
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
## 2014-03-29 ENCOUNTER — Other Ambulatory Visit (HOSPITAL_COMMUNITY): Payer: 59

## 2014-03-30 ENCOUNTER — Ambulatory Visit (HOSPITAL_COMMUNITY): Payer: Self-pay | Admitting: Psychology

## 2014-03-30 ENCOUNTER — Other Ambulatory Visit (HOSPITAL_COMMUNITY): Payer: 59

## 2014-03-31 ENCOUNTER — Other Ambulatory Visit (HOSPITAL_COMMUNITY): Payer: 59

## 2014-04-01 ENCOUNTER — Other Ambulatory Visit (HOSPITAL_COMMUNITY): Payer: 59

## 2014-04-02 ENCOUNTER — Ambulatory Visit (INDEPENDENT_AMBULATORY_CARE_PROVIDER_SITE_OTHER): Payer: 59 | Admitting: Psychiatry

## 2014-04-02 ENCOUNTER — Encounter (HOSPITAL_COMMUNITY): Payer: Self-pay | Admitting: Psychiatry

## 2014-04-02 ENCOUNTER — Other Ambulatory Visit (HOSPITAL_COMMUNITY): Payer: 59

## 2014-04-02 VITALS — Ht 62.75 in | Wt 184.8 lb

## 2014-04-02 DIAGNOSIS — F332 Major depressive disorder, recurrent severe without psychotic features: Secondary | ICD-10-CM

## 2014-04-02 DIAGNOSIS — F3131 Bipolar disorder, current episode depressed, mild: Secondary | ICD-10-CM

## 2014-04-02 DIAGNOSIS — F1010 Alcohol abuse, uncomplicated: Secondary | ICD-10-CM

## 2014-04-02 MED ORDER — LAMOTRIGINE 100 MG PO TABS: 100.0000 mg | ORAL_TABLET | Freq: Every day | ORAL | Status: DC

## 2014-04-02 NOTE — Progress Notes (Signed)
[facility] Behavioral Health [address] progress Note  Melinda Lawrence [id number] 34 y.o.  04/02/2014 11:30 AM  Chief Complaint:  I still feel very anxious irritable and having crying spells.  I tried to go back to work but after 2 days I had a panic attack.  I left work.  History of Present Illness:  Melinda Lawrence is a 34 year old African-American single employed female who was seen first time on March 1 as initial evaluation came for her follow-up appointment.  She is working at [employer] .  She was referred from intensive outpatient program.  She was given Lamictal and recommended to increase trazodone.  Patient started her job on March 7 and admitted that it was very stressful.  She had a lot of crying spells, irritability, depression and stopped going to work after 2 days.  She continues to have passive and fleeting suicidal thoughts but denies any plan.  She admitted cutting down her drinking and not use any drugs .  She endorse job is stressful but also she has limited social network and support.  She continues to struggle having relationship with her parents.  We recommended to see therapist and she seen once but her next appointment is 3 weeks away.  She is thinking to change her job and trying to find a different job that is less stressful.  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  She admitted poor sleep, irritable mood, highs and lows and continues to have anger issues.  She denies any paranoia or any hallucination.  Patient lives with her 2 children from 2 different relationship.  She has no support from the father of the children.  Her appetite is fair.  She lost weight from the past because she is not eating much.  She is taking Lamictal 25 mg and believe it is making her very sleepy and she started taking and the night.  She denies any rash or itching.  She cut down her Klonopin and trazodone because it is making her also sleepy.  She is compliant with Wellbutrin.  She has no  tremors or shakes.  She also complained of back pain and she scheduled to have an MRI.  She is taking pain medication but sometimes she feels it is not helping as much.  She denies any recent self abusive behavior.  Suicidal Ideation: Passive and fleeting suicidal thoughts but no plan Plan Formed: No Patient has means to carry out plan: No  Homicidal Ideation: No Plan Formed: No Patient has means to carry out plan: No  Past Psychiatric History/Hospitalization(s) Patient endorse history of depression, mood swing and anxiety since her teens.  She admitted history of cutting herself and she has one overdose when she was in her teens.  However she started seeing psychiatrist in 2006 when she was going through severe depression.  She remembered at that time having a relationship problem and using drugs.  She was admitted in [place] for alcohol abuse.  She remember impulsive behavior by using excessive buying shopping for no reason.  She had tried Seroquel which makes her sleepy lithium which make her zombie and Zoloft which did not help her.  She denies any history of psychosis or any hallucination.  She denies any history of physical, emotional and verbal abuse in the past. Anxiety: Yes Bipolar Disorder: Patient has diagnosed with bipolar disorder in the past Depression: Yes Mania: Yes Psychosis: No Schizophrenia: No Personality Disorder: No Hospitalization for psychiatric illness: Yes History of Electroconvulsive  Shock Therapy: No Prior Suicide Attempts: Patient has taken overdose of medication in her teens.  She has history of self abusive behavior  Medical History; Patient endorse she has chronic back pain and bulging disc.  She is taking narcotic pain medication from her primary care physician.  Her physician is Angelina Pih at [facility] family physician.   ] Review of Systems: Psychiatric: Agitation: Yes Hallucination: No Depressed Mood: Yes Insomnia: Yes Hypersomnia: No Altered  Concentration: No Feels Worthless: No Grandiose Ideas: No Belief In Special Powers: No New/Increased Substance Abuse: Yes Compulsions: No  Neurologic: Headache: No Seizure: No Paresthesias: No   Musculoskeletal: Strength & Muscle Tone: within normal limits Gait & Station: normal Patient leans: N/A   Outpatient Encounter Prescriptions as of 04/02/2014  Medication Sig  . buPROPion (WELLBUTRIN XL) 300 MG 24 hr tablet Take 1 tablet (300 mg total) by mouth daily. Take 300 mg daily  . clonazePAM (KLONOPIN) 1 MG tablet Take 1 mg by mouth 2 (two) times daily.  Marland Kitchen HYDROcodone-acetaminophen (NORCO/VICODIN) 5-325 MG per tablet Take 1 tablet by mouth.  . lamoTRIgine (LAMICTAL) 100 MG tablet Take 1 tablet (100 mg total) by mouth daily.  Marland Kitchen NUVARING 0.12-0.015 MG/24HR vaginal ring   . omeprazole (PRILOSEC OTC) 20 MG tablet Take 20 mg by mouth.  . traZODone (DESYREL) 100 MG tablet Take 1 tablet (100 mg total) by mouth at bedtime.  . [DISCONTINUED] lamoTRIgine (LAMICTAL) 25 MG tablet Take 1 tab daily for 1 week and tab  2 tab daily  . meloxicam (MOBIC) 15 MG tablet     No results found for this or any previous visit (from the past 2160 hour(s)).    Constitutional:  Ht 5' 2.75" (1.594 m)  Wt 184 lb 12.8 oz (83.825 kg)  BMI 32.99 kg/m2  LMP 01/23/2014 (Approximate)   Mental Status Examination;  Patient is casually dressed and fairly groomed.  She is tearful, emotional and labile.  Her thought process is circumstantial.  She described her mood depressed sad and anxious and her affect is mood appropriate.  She admitted passive and fleeting suicidal thoughts but no plan or any intent.  She denies any hallucination, paranoia or any homicidal thoughts.  Her attention concentration is fair.  There were no delusions or any obsessive parts.  Her psychomotor activity is ncreased.  Her fund of knowledge is average.  She has no tremors or shakes.  Her cognition is good.  She is alert and oriented 3.   Her insight judgment and impulse control is okay.   New problem, with additional work up planned, Review of Psycho-Social Stressors (1), Review and summation of old records (2), Established Problem, Worsening (2), Review of Medication Regimen & Side Effects (2) and Review of New Medication or Change in Dosage (2)  Assessment: Axis I: Major depressive disorder, recurrent.  Rule out bipolar disorder depressed type.  Alcohol abuse  Axis II: Deferred  Axis III:  Past Medical History  Diagnosis Date  . Thyroid disease   . Anemia   . Ovarian cyst   . Anxiety   . Depression      Plan:  Reassurance given as patient stopped taking Lamictal and it will take some time for medicine to help.  However I encourage her to see therapist more frequently if possible.  At this time patient does not have any side effects of Lamictal.  I would increase Lamictal 50 mg one week and then 75 mg second week and 100 mg daily.  We also  talk about job Therapist, nutritionalaccommodation or FMLA.  I believe patient will get benefit if reduced hours at work is available.  Patient will contact her HR and she will inform us.  I recommended if she has a rash that she should call us immediately.  Recommended to take trazodone only if she cannot sleep since Lamictal helping her sleep.  Also recommended to take Klonopin as a when necessary and continue Wellbutrin at present dose.  Recommended to call us back if she has any question or any concern.  Follow-up in 3 weeks. Time spent 25 minutes.  More than 50% of the time spent in psychoeducation, counseling and coordination of care.  Discuss safety plan that anytime having active suicidal thoughts or homicidal thoughts then patient need to call 911 or go to the local emergency room.    ARFEEN,SYED T., MD 04/02/2014

## 2014-04-05 ENCOUNTER — Other Ambulatory Visit (HOSPITAL_COMMUNITY): Payer: 59

## 2014-04-06 ENCOUNTER — Other Ambulatory Visit (HOSPITAL_COMMUNITY): Payer: 59

## 2014-04-07 ENCOUNTER — Other Ambulatory Visit (HOSPITAL_COMMUNITY): Payer: 59

## 2014-04-08 ENCOUNTER — Other Ambulatory Visit (HOSPITAL_COMMUNITY): Payer: 59

## 2014-04-08 ENCOUNTER — Telehealth (HOSPITAL_COMMUNITY): Payer: Self-pay | Admitting: *Deleted

## 2014-04-08 NOTE — Telephone Encounter (Signed)
Received FMLA paperwork for patient.  Patient signed release.  I checked with Dr. Lolly MustacheArfeen and Dr. Lolly MustacheArfeen did not agree to fill out FMLA paper work---denied.   I had to leave a voice mail for patient to please return my call(no details given, just please return call), unsure if she will be able to talk to us due to language barrier.  Will wait for call back.  Patient is due to follow up in May with Dr. Lolly MustacheArfeen.

## 2014-04-08 NOTE — Telephone Encounter (Signed)
Notes in EPIC, last office visit, reducing hours was discussed.  Will let Dr. Lolly MustacheArfeen look over notes and see if paper work for reducing hours can be done not completely taking patient out of work.

## 2014-04-09 ENCOUNTER — Other Ambulatory Visit (HOSPITAL_COMMUNITY): Payer: 59

## 2014-04-09 ENCOUNTER — Ambulatory Visit (HOSPITAL_COMMUNITY): Payer: Self-pay | Admitting: Psychology

## 2014-04-18 ENCOUNTER — Other Ambulatory Visit (HOSPITAL_COMMUNITY): Payer: Self-pay | Admitting: Psychiatry

## 2014-04-19 NOTE — Telephone Encounter (Signed)
Patient's refill request for Lamictal denied as patient returns for follow up on 04/27/14 and request too early.

## 2014-04-23 ENCOUNTER — Ambulatory Visit (HOSPITAL_COMMUNITY): Payer: Self-pay | Admitting: Psychology

## 2014-04-27 ENCOUNTER — Ambulatory Visit (INDEPENDENT_AMBULATORY_CARE_PROVIDER_SITE_OTHER): Payer: 59 | Admitting: Psychiatry

## 2014-04-27 ENCOUNTER — Encounter (HOSPITAL_COMMUNITY): Payer: Self-pay | Admitting: Psychiatry

## 2014-04-27 VITALS — BP 128/86 | HR 93 | Ht 62.75 in | Wt 179.6 lb

## 2014-04-27 DIAGNOSIS — F3131 Bipolar disorder, current episode depressed, mild: Secondary | ICD-10-CM

## 2014-04-27 DIAGNOSIS — F1010 Alcohol abuse, uncomplicated: Secondary | ICD-10-CM

## 2014-04-27 DIAGNOSIS — M549 Dorsalgia, unspecified: Secondary | ICD-10-CM

## 2014-04-27 HISTORY — DX: Dorsalgia, unspecified: M54.9

## 2014-04-27 MED ORDER — TRAZODONE HCL 100 MG PO TABS: 100.0000 mg | ORAL_TABLET | Freq: Every day | ORAL | Status: DC

## 2014-04-27 MED ORDER — LAMOTRIGINE 150 MG PO TABS: 150.0000 mg | ORAL_TABLET | Freq: Every day | ORAL | Status: DC

## 2014-04-27 MED ORDER — BUPROPION HCL ER (XL) 300 MG PO TB24: 300.0000 mg | ORAL_TABLET | Freq: Every day | ORAL | Status: DC
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
## 2014-04-28 ENCOUNTER — Ambulatory Visit (INDEPENDENT_AMBULATORY_CARE_PROVIDER_SITE_OTHER): Payer: 59 | Admitting: Psychology

## 2014-04-28 DIAGNOSIS — F410 Panic disorder [episodic paroxysmal anxiety] without agoraphobia: Secondary | ICD-10-CM

## 2014-04-28 DIAGNOSIS — F331 Major depressive disorder, recurrent, moderate: Secondary | ICD-10-CM

## 2014-04-28 NOTE — Progress Notes (Signed)
   THERAPIST PROGRESS NOTE  Session Time: 9am-10am  Participation Level: Active  Behavioral Response: Well GroomedAlertDepressed  Type of Therapy: Individual Therapy  Treatment Goals addressed: Diagnosis: MDD, Panic D/O and goal 1.  Interventions: CBT and Supportive  Summary: Melinda Lawrence is a 34 y.o. female who presents with depressed mood and affect.  Pt was tearful at times.  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  Pt reported she has been having back pain and was taken out of work by [employer] till 05/11/14 when will have tx.  Pt reported she needs to return financially but doesn't want to return to this job.  Pt discussed how she is not getting fulfillment out of job but also scared to start new path.  Pt continues to express that biggest barrier is being single parent.  Pt discussed parents may move to [place] to live closer to maternal grandmother and are encouraging pt to move as well.Pt discussed grateful for support of her parents w/ re: to her children but feeling that she is a disappointment to parents and feels inadequate w/ some of their statements.   Pt was able to identify negative self talk and how to reframe and acknowledge being who she is and valuing who she is.    Suicidal/Homicidal: Nowithout intent/plan  Therapist Response: Assessed pt current functioning per pt report.  Processed w/ pt her depressed mood and fears w/ making job change.  Validated pt feelings and encouraged pt to not minimize her feelings.  Explored w/pt her interactions w/ parents and pt cognitive distortions.  Assisted pt w/ reframing and acknowledging her self worth.   Plan: Return again in 2 weeks.  Diagnosis: MDD, panic D/o    Melinda Lawrence, [facility] 04/28/2014

## 2014-05-18 ENCOUNTER — Ambulatory Visit (INDEPENDENT_AMBULATORY_CARE_PROVIDER_SITE_OTHER): Payer: PRIVATE HEALTH INSURANCE | Admitting: Psychology

## 2014-05-18 DIAGNOSIS — F410 Panic disorder [episodic paroxysmal anxiety] without agoraphobia: Secondary | ICD-10-CM | POA: Diagnosis not present

## 2014-05-18 DIAGNOSIS — F331 Major depressive disorder, recurrent, moderate: Secondary | ICD-10-CM | POA: Diagnosis not present

## 2014-05-18 NOTE — Progress Notes (Signed)
   THERAPIST PROGRESS NOTE  Session Time: 9.05am-9.54am  Participation Level: Active  Behavioral Response: Well GroomedAlertDepressed and tearful  Type of Therapy: Individual Therapy  Treatment Goals addressed: Diagnosis: MDD, panic D/O and goal 1.  Interventions: CBT, Supportive and Other: Self Compassion  Summary: Melinda Lawrence is a 34 y.o. female who presents with affect depressed.  Pt reported she is still struggling w/ back pain despite intervention received last week.  Pt reported that she attempted to back to work on 05/12/14 and she reported she had a major panic attack that day.  Pt reported that EMS was called to her work.  Pt reported that she wasn't able to identify any triggers and that she felt good that day prior to panic attack.  Pt reported that she hasn't returned to work and doesn't want to go back.  Pt reported she is taking sick pay.  Pt reported that she still cries easily- reports depressed mood but not struggling w/ hopelessness and no SI.  Pt struggled initially to identify any self care or positive feelings experienced lately- but was able to reflect on positive interaction w/ friends and things that felt good to do. Pt discussed support system w/ friends and how building for daughter w/ half siblings. Pt discussed her parents encouraging move to [place] with them.  Pt discussed benefits and concerns with doing so.  Pt acknowledged that she is looking for move from [place].  Pt was able to increase awareness of negative self talk and judging and critiquing self constantly.  Pt agreed to focus on self compassion.   .   Suicidal/Homicidal: Nowithout intent/plan  Therapist Response: Assessed pt current functioning per pt report.  Processed w/pt her return to work- validated feelings associated w/ avoidance of panic attacks, but encouraged pt linking to supports and coping skills for non avoidance.  Focused pt on self care and acknowledging what she is doing for self  care and what other things she can engage in for self care and giving opportunity for experiencing positive emotion.  Discussed Kristen Neff's concept of self compassion and how to engage for self.    Plan: Return again in 1-2 weeks. Pt to f/u w/ further reading of self compassion [email or web address]  Pt to f/u w/ Dr. Lolly MustacheArfeen.   Diagnosis: MDD, Panic D/O w/out agoraphobia     Sylvia Kondracki, LPC 05/18/2014

## 2014-05-27 ENCOUNTER — Encounter (HOSPITAL_COMMUNITY): Payer: Self-pay | Admitting: Psychiatry

## 2014-05-27 ENCOUNTER — Ambulatory Visit (INDEPENDENT_AMBULATORY_CARE_PROVIDER_SITE_OTHER): Payer: 59 | Admitting: Psychiatry

## 2014-05-27 ENCOUNTER — Ambulatory Visit (HOSPITAL_COMMUNITY): Payer: Self-pay | Admitting: Psychiatry

## 2014-05-27 VITALS — BP 132/87 | HR 90 | Ht 62.75 in | Wt 172.8 lb

## 2014-05-27 DIAGNOSIS — F410 Panic disorder [episodic paroxysmal anxiety] without agoraphobia: Secondary | ICD-10-CM

## 2014-05-27 DIAGNOSIS — F1021 Alcohol dependence, in remission: Secondary | ICD-10-CM

## 2014-05-27 DIAGNOSIS — F3131 Bipolar disorder, current episode depressed, mild: Secondary | ICD-10-CM

## 2014-05-27 DIAGNOSIS — F3130 Bipolar disorder, current episode depressed, mild or moderate severity, unspecified: Secondary | ICD-10-CM | POA: Diagnosis not present

## 2014-05-27 MED ORDER — BUPROPION HCL ER (XL) 300 MG PO TB24: 300.0000 mg | ORAL_TABLET | Freq: Every day | ORAL | Status: DC

## 2014-05-27 MED ORDER — LAMOTRIGINE 200 MG PO TABS: 200.0000 mg | ORAL_TABLET | Freq: Every day | ORAL | Status: DC

## 2014-05-27 NOTE — Progress Notes (Signed)
[facility] [id number] progress Note  Melinda Lawrence [id number] 34 y.o.  05/27/2014 10:47 AM  Chief Complaint:  I had a panic attack at work.  Ambulance was called but I decided not to go .  I'm nervous and anxious.      History of Present Illness:  Melinda Lawrence came for her follow-up appointment.  She is emotional and tearful and described that her anxiety symptoms are getting worse.  She had a panic attack at work and embolus was called but she decided not to go to ambulance and stay with her dad's house.  Patient mentioned 2 days earlier she has given steroid injection for her back pain and she is not sure if she had a side effects of medication or not taking her Klonopin that day makes her anxiety worse.  She missed the work and she is feeling nervous and anxious that it could happen again.  Patient told she was doing fine and felt that she does not need Klonopin and she has been cutting down the Klonopin.  She admitted poor sleep .  She appears tearful and very emotional.  She is taking Lamictal and Wellbutrin.  She has no rash or itching.  She denies any suicidal thoughts or homicidal thought.  However she admitted some time very depressed and isolated withdrawn and having crying spells.  She is seeing Melinda Lawrence for counseling.  She endorse a lot of family issues. She is working at [employer] and she described a job is very stressful.  Patient lives with her 2 children from 2 different relationship.  Patient endorse despite taking street art injection she still have back pain.  She has given Neurontin however she has not started yet.  She still take Vicodin as needed.  Her appetite is okay.  She has lost weight and she is happy about it.  She feels proud that she had stopped drinking alcohol and smoking marijuana.  Suicidal Ideation: No Plan Formed: No Patient has means to carry out plan: No  Homicidal Ideation: No Plan Formed: No Patient has means to carry out plan: No  Past Psychiatric  History/Hospitalization(s) Patient endorse history of depression, mood swing and anxiety since her teens.  She admitted history of cutting herself and she has one overdose when she was in her teens.  She started seeing psychiatrist in 2006 due to severe depression .  She remembered at that time having a relationship problem and using drugs.  She was admitted in [place] for alcohol abuse.  She remember impulsive behavior by using excessive buying shopping for no reason.  She had tried Seroquel which makes her sleepy lithium which make her zombie and Zoloft which did not help her.  She denies any history of psychosis or any hallucination.  She denies any history of physical, emotional and verbal abuse in the past. Anxiety: Yes Bipolar Disorder: Patient has diagnosed with bipolar disorder in the past Depression: Yes Mania: Yes Psychosis: No Schizophrenia: No Personality Disorder: No Hospitalization for psychiatric illness: Yes History of Electroconvulsive Shock Therapy: No Prior Suicide Attempts: Patient has taken overdose of medication in her teens.  She has history of self abusive behavior  Medical History; Patient endorse she has chronic back pain and bulging disc.  She is taking narcotic pain medication from her primary care physician.  Her physician is Angel[facility]y Camile [facility].   ] Review of Systems  Constitutional: Positive for weight loss. Negative for malaise/fatigue.  Eyes: Negative for blurred vision.  Gastrointestinal: Negative for constipation.  Musculoskeletal: Positive for back pain and joint pain.  Skin: Negative for itching and rash.  Neurological: Negative for tremors and headaches.  Psychiatric/Behavioral: Positive for depression. Negative for suicidal ideas, hallucinations and substance abuse. The patient is nervous/anxious and has insomnia.        Anxiety attack    Psychiatric: Agitation: No Hallucination: No Depressed Mood: Yes Insomnia:  Yes Hypersomnia: No Altered Concentration: No Feels Worthless: No Grandiose Ideas: No Belief In Special Powers: No New/Increased Substance Abuse: No Compulsions: No  Neurologic: Headache: No Seizure: No Paresthesias: No   Musculoskeletal: Strength & Muscle Tone: within normal limits Gait & Station: normal Patient leans: N/A   Outpatient Encounter Prescriptions as of 05/27/2014  Medication Sig  . buPROPion (WELLBUTRIN XL) 300 MG 24 hr tablet Take 1 tablet (300 mg total) by mouth daily. Take 300 mg daily  . clonazePAM (KLONOPIN) 1 MG tablet Take 1 mg by mouth 2 (two) times daily.  Marland Kitchen. HYDROcodone-acetaminophen (NORCO/VICODIN) 5-325 MG per tablet Take 1 tablet by mouth.  . lamoTRIgine (LAMICTAL) 200 MG tablet Take 1 tablet (200 mg total) by mouth daily.  . meloxicam (MOBIC) 15 MG tablet   . NUVARING 0.12-0.015 MG/24HR vaginal ring   . omeprazole (PRILOSEC OTC) 20 MG tablet Take 20 mg by mouth.  . traZODone (DESYREL) 100 MG tablet Take 1 tablet (100 mg total) by mouth at bedtime.  . [DISCONTINUED] buPROPion (WELLBUTRIN XL) 300 MG 24 hr tablet Take 1 tablet (300 mg total) by mouth daily. Take 300 mg daily  . [DISCONTINUED] lamoTRIgine (LAMICTAL) 150 MG tablet Take 1 tablet (150 mg total) by mouth daily.   No facility-administered encounter medications on file as of 05/27/2014.    No results found for this or any previous visit (from the past 2160 hour(s)).    Constitutional:  BP 132/87 mmHg  Pulse 90  Ht 5' 2.75" (1.594 m)  Wt 172 lb 12.8 oz (78.382 kg)  BMI 30.85 kg/m2   Mental Status Examination;  Patient is casually dressed and fairly groomed.  She is emotional but cooperative.  Her thought process is circumstantial.  She described her mood anxious and her affect is mood appropriate.  She denies any auditory or visual hallucination.  She denies any active or passive suicidal thoughts or homicidal thought.  Her attention concentration is fair.  There were no delusions or any  obsessive parts.  Her psychomotor activity is ncreased.  Her fund of knowledge is average.  She has no tremors or shakes.  Her cognition is good.  She is alert and oriented 3.  Her insight judgment and impulse control is okay.   Established Problem, Stable/Improving (1), Review of Psycho-Social Stressors (1), Review and summation of old records (2), Established Problem, Worsening (2), Review of Medication Regimen & Side Effects (2) and Review of New Medication or Change in Dosage (2)  Assessment: Axis I: Major depressive disorder, recurrent.  Bipolar disorder depressed type.  Alcohol abuse in partial remission, panic attacks  Axis II: Deferred  Axis III:  Past Medical History  Diagnosis Date  . Thyroid disease   . Anemia   . Ovarian cyst   . Anxiety   . Depression   . Back pain 04/27/14    Will be having a steroid injection on 05/11/14.     Plan:  I review her stressors, current medication and collateral information.  Patient may have panic attack due to not taking her Klonopin or could have side effects due  to steroid injection which was given for her back pain.  I encourage to take Klonopin 0.5 mg as needed to prevent any panic attack .  I will also increase her Lamictal to 200 mg daily.  She has no rash itching or any side effects.  She feel improvement with the Lamictal.  Encouraged to see Glena Norfolk for counseling.  Continue Wellbutrin XL 300 mg daily.  We also talk about gabapentin side effects and efficacy.  She has not started yet but she like to have more research before she start this medication.  Recommended to call us back if she has any question or any concern.  Follow-up in 4 weeks. Time spent 25 minutes.  More than 50% of the time spent in psychoeducation, counseling and coordination of care.  Discuss safety plan that anytime having active suicidal thoughts or homicidal thoughts then patient need to call 911 or go to the local emergency room.    Cloa Bushong T.,  MD 05/27/2014

## 2014-06-02 ENCOUNTER — Telehealth (HOSPITAL_COMMUNITY): Payer: Self-pay

## 2014-06-02 NOTE — Telephone Encounter (Signed)
Telephone call with patient to follow up on her left message stating she had began to develop rashes, mostly on her legs and also on her arms after recent increase in Lamictal.  Patient stated she stopped the Lamictal after taking last dosage 06/01/14 morning.  Patient states the rashes have improved today, mostly gone from her arms but still some on her legs.  Patient stated they itched but this was improving.  Patient agreed not to take any more Lamictal until this could be presented to Dr. Lolly MustacheArfeen and we would call her back with further instruction.

## 2014-06-03 NOTE — Telephone Encounter (Signed)
Met with Dr. Adele Schilder to discuss patient's status with developed rash from Lamictal and that she had stopped it 2 days prior.  Patient was questioning if she should restart another medication.  Dr. Adele Schilder stated he wanted her to go off medication with none added back for a period and then we could look at what to add back if needed.  Called patient back to inform of her need to remain off her recently prescribed Lamictal per Dr. Adele Schilder and that he did not want to restart anything else at this time.  Patient stated concern for going too long without something and rescheduled an appointment for 06/17/14 at 10:45am.  Patient to call if any problems prior to then.

## 2014-06-08 ENCOUNTER — Ambulatory Visit (HOSPITAL_COMMUNITY): Payer: Self-pay | Admitting: Psychiatry

## 2014-06-09 ENCOUNTER — Ambulatory Visit (INDEPENDENT_AMBULATORY_CARE_PROVIDER_SITE_OTHER): Payer: PRIVATE HEALTH INSURANCE | Admitting: Psychology

## 2014-06-09 DIAGNOSIS — F331 Major depressive disorder, recurrent, moderate: Secondary | ICD-10-CM | POA: Diagnosis not present
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
## 2014-06-16 ENCOUNTER — Ambulatory Visit (INDEPENDENT_AMBULATORY_CARE_PROVIDER_SITE_OTHER): Payer: PRIVATE HEALTH INSURANCE | Admitting: Psychology

## 2014-06-16 DIAGNOSIS — F332 Major depressive disorder, recurrent severe without psychotic features: Secondary | ICD-10-CM | POA: Diagnosis not present

## 2014-06-16 NOTE — Progress Notes (Signed)
   THERAPIST PROGRESS NOTE  Session Time: 9:07am-10am  Participation Level: Active  Behavioral Response: Well GroomedAlertDepressed and tearful  Type of Therapy: Individual Therapy  Treatment Goals addressed: Diagnosis: MDD and goal 1  Interventions: CBT and Other: self compassion strategies  Summary: Melinda Fraisedrienne B Iannone is a 34 y.o. female who presents with depressed mood and affect.  Pt is tearful in session.  Pt reports low energy and struggling w/ feeling negative self worth.  Pt reported she hasn't been to work since her last session. Pt reported that feels very overwhelmed w/ thinks about returning. Pt reported that she has been attempting to do things outside of the home- but little energy and motivation.  Pt reported hasn't gone to support group.  Pt reports that since stopped Lamictal feels more depressed.  Pt expressed discouraged as if starting over w/ tx.  Pt is able to identify cognitive distortions but struggles to challenge them.  Pt expresses at times feels that needs to leave and have parents care for children as low self worth feelings. Pt admits to passive SI- "better off if not here".  Pt expresses that wants to be here for children, doesn't want to die or end her own life.  Pt agrees to seek support from parents if needing assistance w/ caregiving to focus on her tx of depression. Pt reports seeking disability from her job and will f/u w/ Dr Florentina Lawrence for recommendation.   Suicidal/Homicidal: passive SIwithout intent/plan.  Pt agrees to f/u w/ Melinda Lawrence as scheduled on 06/17/14.  Pt agrees to seek crisis services SI w/ intent or plan.    Therapist Response: Assessed pt current functioning per pt report.  Processed w/pt reported increased depression.  Reflected to pt negative self talk and assisted pt in awareness of self compassion challenges.  Assisted pt in recognizing positives in self and how she provides care for children.  Assessed for safety and discussed plan for safety-  including seeking appropriate tx as needed.    Plan: Return again in 1 weeks.  Return tomorrow as scheduled for Melinda Lawrence  Diagnosis: MDD, recurrent, severe     Melinda Lawrence, [facility] 06/16/2014

## 2014-06-17 ENCOUNTER — Encounter (HOSPITAL_COMMUNITY): Payer: Self-pay | Admitting: Psychiatry

## 2014-06-17 ENCOUNTER — Ambulatory Visit (INDEPENDENT_AMBULATORY_CARE_PROVIDER_SITE_OTHER): Payer: 59 | Admitting: Psychiatry

## 2014-06-17 VITALS — BP 130/94 | HR 84 | Ht 62.75 in | Wt 170.6 lb

## 2014-06-17 DIAGNOSIS — F3132 Bipolar disorder, current episode depressed, moderate: Secondary | ICD-10-CM

## 2014-06-17 DIAGNOSIS — F1010 Alcohol abuse, uncomplicated: Secondary | ICD-10-CM | POA: Diagnosis not present

## 2014-06-17 DIAGNOSIS — F331 Major depressive disorder, recurrent, moderate: Secondary | ICD-10-CM

## 2014-06-17 MED ORDER — ARIPIPRAZOLE 5 MG PO TABS: 5.0000 mg | ORAL_TABLET | Freq: Every day | ORAL | Status: DC

## 2014-06-17 NOTE — Progress Notes (Signed)
[facility] MD Progress Note  06/17/2014 11:31 AM Melinda Lawrence  MRN:  [id number] Subjective:   I'm feeling very depressed and sad.  I stopped taking Lamictal because of rash.  I'm unable to go back to work.  History Of Presenting Illness; Patient came for her follow-up appointment.  She stopped taking Lamictal after she developed a rash on her face and leg.  She's been experiencing increased anxiety depression and she is easily tearful and emotional.  She admitted having passive and fleeting suicidal thoughts but no plan.  She's not going to hurt work due to severe depression.  She even asked her mother to keep her 2 children because she is not sure how to function.  She endorse her job is very stressful and she does not want to lose her job.  She is working in [employer].  She admitted poor sleep, racing thoughts, irritability and feeling hopeless and helpless.  Though she denies any hallucination or any paranoia but endorsed social isolation, detached and withdrawn.  She admitted relapsed into drinking on her birthday but denies intoxication.  She denies any illegal substance use.  She is getting Klonopin from her primary care physician who is helping her anxiety.  She is taking Wellbutrin and she denies any tremors or shakes.  Her rash of completely resolved.  Her energy level is low.  Her appetite is okay.  Her vitals are stable.  She is no longer taking pain medication because her pain is little bit better.  Diagnosis:   Patient Active Problem List   Diagnosis Date Noted  . Panic disorder without agoraphobia [F41.0] 03/25/2014  . Major depression, recurrent [F33.9] 03/02/2014  . Low grade squamous intraepithelial lesion (LGSIL) on Papanicolaou smear of cervix [R87.612] 06/19/2013   Total Time spent with patient: 20 minutes   Past Medical History:  Patient endorse she has chronic back pain and bulging disc. She is taking narcotic pain medication from her primary care physician. Her physician is Dierdre Forth at [facility].  Past Medical History  Diagnosis Date  . Thyroid disease   . Anemia   . Ovarian cyst   . Anxiety   . Depression   . Back pain 04/27/14    Will be having a steroid injection on 05/11/14.   History reviewed. No pertinent past surgical history. Family History:  Family History  Problem Relation Age of Onset  . Cancer Mother   . Diabetes Father   . Depression Maternal Grandmother    Social History:  Patient lives with HER-2 children who are 65 and 25 year old.  Both of her children are from 2 different relationship. History  Alcohol Use No     History  Drug Use No    History   Social History  . Marital Status: Single    Spouse Name: N/A  . Number of Children: N/A  . Years of Education: N/A   Social History Main Topics  . Smoking status: Current Every Day Smoker -- 0.50 packs/day    Types: Cigarettes  . Smokeless tobacco: Never Used  . Alcohol Use: No  . Drug Use: No  . Sexual Activity:    Partners: Female, Female    Birth Control/ Protection: Inserts   Other Topics Concern  . None   Social History Narrative   Psychiatric History; Patient endorse history of depression, mood swing and anxiety since her teens. She admitted history of cutting herself and she has one overdose when she was in her teens. She started  seeing psychiatrist in 2006 due to severe depression . She remembered at that time having a relationship problem and using drugs. She was admitted in [place] for alcohol abuse. She remember impulsive behavior by using excessive buying shopping for no reason. She had tried Seroquel which makes her sleepy lithium which make her zombie and Zoloft which did not help her. She denies any history of psychosis or any hallucination. She denies any history of physical, emotional and verbal abuse in the past.   Sleep: Poor  Appetite:  Fair   Assessment:   Musculoskeletal: Strength & Muscle Tone: within normal limits Gait  & Station: normal Patient leans: N/A   Psychiatric Specialty Exam: Physical Exam  Review of Systems  Constitutional: Positive for malaise/fatigue.  Eyes: Negative for blurred vision.  Cardiovascular: Negative for chest pain.  Musculoskeletal: Negative.   Skin: Negative for itching.       Rash resolved  Neurological: Positive for headaches. Negative for dizziness and tremors.  Psychiatric/Behavioral: Positive for depression and suicidal ideas. The patient is nervous/anxious and has insomnia.     Blood pressure 130/94, pulse 84, height 5' 2.75" (1.594 m), weight 170 lb 9.6 oz (77.384 kg).Body mass index is 30.46 kg/(m^2).  General Appearance: Disheveled  Eye Sport and exercise psychologist::  Fair  Speech:  Slow  Volume:  Decreased  Mood:  Anxious, Depressed, Dysphoric, Hopeless and Irritable  Affect:  Constricted and Depressed  Thought Process:  Linear and Logical  Orientation:  Full (Time, Place, and Person)  Thought Content:  Rumination  Suicidal Thoughts:  Yes.  without intent/plan  Homicidal Thoughts:  No  Memory:  Immediate;   Fair Recent;   Fair Remote;   Fair  Judgement:  Fair  Insight:  Fair  Psychomotor Activity:  Decreased  Concentration:  Fair  Recall:  [employer] of Knowledge:Fair  Language: Fair  Akathisia:  No  Handed:  Right  AIMS (if indicated):     Assets:  Communication Skills Desire for Improvement Financial Resources/Insurance Housing Physical Health Social Support  ADL's:  Intact  Cognition: WNL  Sleep:        Current Medications: Current Outpatient Prescriptions  Medication Sig Dispense Refill  . ARIPiprazole (ABILIFY) 5 MG tablet Take 1 tablet (5 mg total) by mouth daily. 30 tablet 0  . buPROPion (WELLBUTRIN XL) 300 MG 24 hr tablet Take 1 tablet (300 mg total) by mouth daily. Take 300 mg daily 30 tablet 0  . clonazePAM (KLONOPIN) 1 MG tablet Take 1 mg by mouth 2 (two) times daily.    Marland Kitchen NUVARING 0.12-0.015 MG/24HR vaginal ring     . omeprazole (PRILOSEC OTC)  20 MG tablet Take 20 mg by mouth.    . traZODone (DESYREL) 100 MG tablet Take 1 tablet (100 mg total) by mouth at bedtime. 30 tablet 0   No current facility-administered medications for this visit.    Lab Results: No results found for this or any previous visit (from the past 48 hour(s)).  Physical Findings: AIMS:  , ,  ,  ,    CIWA:    COWS:     Treatment Plan Summary: Assessment Major depressive disorder , recurrent moderate.  Bipolar disorder depressed type.  Alcohol abuse .    Plan I review her symptoms, history, current medication and psychosocial stressors.  She is no longer taking Lamictal due to rash.  I will put Lamictal and her allergy sections.  She relapsed once into drinking on her birthday.  Discuss stopping alcohol due to further  increasing the depression.  I will add Abilify 5 mg half to one tablet to help the depression and irritability.  Continue Wellbutrin XL 300 mg daily.  She is getting Klonopin from her primary care physician to help anxiety symptoms.  We talked about starting IOP which she has done in the past.  At this time she is not ready to go back to work due to increased depression.  She was stopped IOP on June 1.  We will take her off from the work.  We talked about medication side effects including metabolic syndrome with Abilify.  Discuss safety concern that anytime active suicidal thoughts or homicidal thoughts and she need to call 911 or go to a local emergency room.  I will see her again when she finished IOP.  Reassurance given.  Time spent 25 minutes.  More than 50% of the time spent in psychoeducation, counseling and coordination of care and also to discuss safety plan.   Medical Decision Making:  New problem, with additional work up planned, Review of Psycho-Social Stressors (1), Established Problem, Worsening (2), Review of Last Therapy Session (1), Review of Medication Regimen & Side Effects (2) and Review of New Medication or Change in Dosage  (2)     Shilee Biggs T. 06/17/2014, 11:31 AM

## 2014-06-18 ENCOUNTER — Telehealth (HOSPITAL_COMMUNITY): Payer: Self-pay

## 2014-06-18 NOTE — Telephone Encounter (Signed)
Telephone message left for patient after she had left a message questioning if any FMLA paperwork had been completed for her.  Informed on message left this nurse had not seen any FMLA forms for patient at this time but would also follow up with IOP to question if they had received any to be completed.  Requested on message patient follow up with our office on Tuesday 06/22/14 as will be out next day open for outpatient services.

## 2014-06-23 ENCOUNTER — Other Ambulatory Visit (HOSPITAL_COMMUNITY): Payer: PRIVATE HEALTH INSURANCE | Attending: Psychiatry | Admitting: Psychiatry

## 2014-06-23 ENCOUNTER — Ambulatory Visit (HOSPITAL_COMMUNITY): Payer: Self-pay | Admitting: Psychology

## 2014-06-23 ENCOUNTER — Encounter (HOSPITAL_COMMUNITY): Payer: Self-pay | Admitting: Psychiatry

## 2014-06-23 DIAGNOSIS — F411 Generalized anxiety disorder: Secondary | ICD-10-CM | POA: Insufficient documentation

## 2014-06-23 DIAGNOSIS — F331 Major depressive disorder, recurrent, moderate: Secondary | ICD-10-CM

## 2014-06-23 NOTE — Telephone Encounter (Signed)
Wilder GladeShawn T., RN left forms on desk, waiting for time period patient will be in IOP.  Patient started IOP today, Colwich SinkRita stated 2-3 weeks. Forms where faxed today.  Copy made to be scanned.  Original put in Dr. Sheela StackArfeen's file folder file cabinet.
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
## 2014-06-23 NOTE — Progress Notes (Signed)
    Daily Group Progress Note  Program: IOP  Group Time: 9:00-10:30  Participation Level: Active  Behavioral Response: Appropriate  Type of Therapy:  Group Therapy  Summary of Progress: Pt. Met with case manager and psychiatrist. Pt. Reported that she was doing "ok" and coping with medication change.      Group Time: 10:30-12:00  Participation Level:  Active  Behavioral Response: Appropriate  Type of Therapy: Psycho-education Group  Summary of Progress: Pt. Participated in progressive muscle relaxation activity.  Nancie Neas, LPC

## 2014-06-23 NOTE — Addendum Note (Signed)
Addended by: Sheralyn BoatmanLARK, MARGUERITE P on: 06/23/2014 02:32 PM   Modules accepted: Medications

## 2014-06-23 NOTE — Progress Notes (Signed)
Psychiatric Assessment Adult  Patient Identification:  KYERA FELAN Date of Evaluation:  06/23/2014 Chief Complaint: depression and anxiety History of Chief Complaint:  Ms Masser was in the IOP program about 3 months ago for similar reasons.  She did better after the discharge and returned to work.  She was out for medical reasons once and after a panic attack at work that resulted in the EMS being called.  Both times she had a hard time returning to work. Her job is her main stressor but she needs it.  Most recently she had to stop the Lamictal which had been fairly helpful because of a rash.  She is currently on aripiprazole and it seems to be helping some she believes.  Nevertheless, she still feels anxious daily, has crying spells, has decreased energy and motivation and increased irritability.  She does not have suicidal thoughts.  She is stressed by her son's ADHD which sounds under treated.  She worries about treating him with any medications at all.  She is a single parent and her 59 year old daughter is also showing some ADHD like symptoms.  The techniques she learned here last time have helped her deal with stressors.  She feels unable to return to work at he moment and that is why she is back.  HPI Review of Systems Physical Exam  Depressive Symptoms: depressed mood, fatigue, difficulty concentrating, anxiety,  (Hypo) Manic Symptoms:   Elevated Mood:  Negative Irritable Mood:  Yes Grandiosity:  Negative Distractibility:  Negative Labiality of Mood:  Yes Delusions:  Negative Hallucinations:  Negative Impulsivity:  Negative Sexually Inappropriate Behavior:  Negative Financial Extravagance:  Negative Flight of Ideas:  Negative  Anxiety Symptoms: Excessive Worry:  Yes Panic Symptoms:  Yes Agoraphobia:  Negative Obsessive Compulsive: Negative  Symptoms: None, Specific Phobias:  Negative Social Anxiety:  Negative  Psychotic Symptoms:  Hallucinations: Negative  None Delusions:  Negative Paranoia:  Negative   Ideas of Reference:  No  PTSD Symptoms: Ever had a traumatic exposure:  Negative Had a traumatic exposure in the last month:  Negative Re-experiencing: Negative None Hypervigilance:  Negative Hyperarousal: Negative None Avoidance: Negative None  Traumatic Brain Injury: Negative na  Past Psychiatric History: Diagnosis: depression and anxiety disorders and consideration of bipolar disorder  Hospitalizations: none reported  Outpatient Care: sees psychiatrist and therapist  Substance Abuse Care: none  Self-Mutilation: none  Suicidal Attempts: none  Violent Behaviors: none   Past Medical History:   Past Medical History  Diagnosis Date  . Thyroid disease   . Anemia   . Ovarian cyst   . Anxiety   . Depression   . Back pain 04/27/14    Will be having a steroid injection on 05/11/14.   History of Loss of Consciousness:  Negative Seizure History:  Negative Cardiac History:  Negative Allergies:   Allergies  Allergen Reactions  . Lamictal [Lamotrigine] Rash   Current Medications:  Current Outpatient Prescriptions  Medication Sig Dispense Refill  . ARIPiprazole (ABILIFY) 5 MG tablet Take 1 tablet (5 mg total) by mouth daily. 30 tablet 0  . buPROPion (WELLBUTRIN XL) 300 MG 24 hr tablet Take 1 tablet (300 mg total) by mouth daily. Take 300 mg daily 30 tablet 0  . clonazePAM (KLONOPIN) 1 MG tablet Take 1 mg by mouth 2 (two) times daily.    Marland Kitchen NUVARING 0.12-0.015 MG/24HR vaginal ring     . omeprazole (PRILOSEC OTC) 20 MG tablet Take 20 mg by mouth.    Marland Kitchen  traZODone (DESYREL) 100 MG tablet Take 1 tablet (100 mg total) by mouth at bedtime. 30 tablet 0   No current facility-administered medications for this visit.    Previous Psychotropic Medications:  Medication Dose   see above list  na                     Substance Abuse History in the last 12 months:none                                                                                                    Medical Consequences of Substance Abuse: none  Legal Consequences of Substance Abuse: none  Family Consequences of Substance Abuse: none  Blackouts:  Negative DT's:  Negative Withdrawal Symptoms:  Negative None  Social History: Current Place of Residence: [place] Place of Birth: [place] Family Members: single mother Marital Status:  Divorced Children: 2  Sons: 1  Daughters: 1 Relationships: none of significance Education:  [employer] Educational Problems/Performance: good Religious Beliefs/Practices: Christian History of Abuse: none Teacher, musicccupational Experiences; Military History:  None. Legal History: none Hobbies/Interests: music  Family History:   Family History  Problem Relation Age of Onset  . Cancer Mother   . Diabetes Father   . Depression Maternal Grandmother     Mental Status Examination/Evaluation: Objective:  Appearance: Well Groomed  Eye Contact::  Good  Speech:  Clear and Coherent  Volume:  Normal  Mood:  depressed  Affect:  Appropriate  Thought Process:  Coherent and Logical  Orientation:  Full (Time, Place, and Person)  Thought Content:  Negative  Suicidal Thoughts:  No  Homicidal Thoughts:  No  Judgement:  Good  Insight:  Fair  Psychomotor Activity:  Normal  Akathisia:  Negative  Handed:  Right  AIMS (if indicated):  0  Assets:  Communication Skills Desire for Improvement Financial Resources/Insurance Housing Physical Health Talents/Skills Transportation Vocational/Educational    Laboratory/X-Ray Psychological Evaluation(s)   na  na   Assessment:  Major depression, recurrent, moderate. Generalized anxiety disorder                  Treatment Plan/Recommendations:  Plan of Care: daily group therapy  Laboratory:  na  Psychotherapy: group therapy  Medications: continue current meds  Routine PRN Medications:  Negative  Consultations: none  Safety Concerns:  none  Other:      Benjaman PottAYLOR,Mersedes Alber  D, MD 6/1/20161:47 PM

## 2014-06-24 ENCOUNTER — Other Ambulatory Visit (HOSPITAL_COMMUNITY): Payer: PRIVATE HEALTH INSURANCE | Admitting: Psychiatry

## 2014-06-24 ENCOUNTER — Telehealth (HOSPITAL_COMMUNITY): Payer: Self-pay | Admitting: Psychiatry

## 2014-06-25 ENCOUNTER — Other Ambulatory Visit (HOSPITAL_COMMUNITY): Payer: PRIVATE HEALTH INSURANCE

## 2014-06-28 ENCOUNTER — Other Ambulatory Visit (HOSPITAL_COMMUNITY): Payer: PRIVATE HEALTH INSURANCE | Admitting: Psychiatry

## 2014-06-28 DIAGNOSIS — F331 Major depressive disorder, recurrent, moderate: Secondary | ICD-10-CM | POA: Diagnosis not present

## 2014-06-28 NOTE — Progress Notes (Signed)
    Daily Group Progress Note  Program: IOP  Group Time: 9:00-10:30  Participation Level: Active  Behavioral Response: Appropriate  Type of Therapy:  Psycho-education Group  Summary of Progress: Pt. Participated in medication education group with [place].      Group Time: 10:30-12:00  Participation Level:  Active  Behavioral Response: Appropriate  Type of Therapy: Group Therapy  Summary of Progress: Pt. Presented with brightened affect, talkative, makes appropriate eye contact, not tearful. Pt. Reported decision to limit use of facebook and other other social media. Pt. 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   Shaune PollackBrown, Jennifer B, LPC

## 2014-06-29 ENCOUNTER — Other Ambulatory Visit (HOSPITAL_COMMUNITY): Payer: PRIVATE HEALTH INSURANCE | Admitting: Psychiatry

## 2014-06-29 DIAGNOSIS — F331 Major depressive disorder, recurrent, moderate: Secondary | ICD-10-CM

## 2014-06-29 NOTE — Progress Notes (Signed)
    Daily Group Progress Note  Program: IOP  Group Time: 9:00-10:30  Participation Level: Active  Behavioral Response: Appropriate  Type of Therapy:  Group Therapy  Summary of Progress: Pt. Actively engaged in group, smiles appropriately, responsive to group process. Pt. Became tearful when discussing daughter's disappointment regarding father's absence. Pt. Participated in discussion about body image and how it can affect mood.      Group Time: 10:30-12:00  Participation Level:  Active  Behavioral Response: Appropriate  Type of Therapy: Psycho-education Group  Summary of Progress: Pt. Participated in discussion about the purpose of painful emotions such as anger, sadness, fear, guilt and loneliness and how mindfulness can be used to cope with distressing emotions.   Shaune PollackBrown, Breianna Delfino B, LPC

## 2014-06-30 ENCOUNTER — Other Ambulatory Visit (HOSPITAL_COMMUNITY): Payer: PRIVATE HEALTH INSURANCE | Admitting: Psychiatry

## 2014-07-01 ENCOUNTER — Other Ambulatory Visit (HOSPITAL_COMMUNITY): Payer: PRIVATE HEALTH INSURANCE | Admitting: Psychiatry

## 2014-07-01 ENCOUNTER — Ambulatory Visit (HOSPITAL_COMMUNITY): Payer: Self-pay | Admitting: Psychiatry

## 2014-07-01 DIAGNOSIS — F331 Major depressive disorder, recurrent, moderate: Secondary | ICD-10-CM | POA: Diagnosis not present

## 2014-07-01 NOTE — Progress Notes (Signed)
    Daily Group Progress Note  Program: IOP  Group Time: 9:00-10:30  Participation Level: Active  Behavioral Response: Appropriate  Type of Therapy:  Group Therapy  Summary of Progress: Pt. Reported that she felt drowsy, but she felt it was the medication. Pt. Continues to report that raising children as a single parent and thoughts about returning to work are her most significant stressors.      Group Time: 10:30-12:00  Participation Level:  Active  Behavioral Response: Appropriate  Type of Therapy: Psycho-education Group  Summary of Progress: Pt. Participated in discussion about recognizing cognitive distortions such as all-or-nothing thinking, mind-reading, overgeneralizing, and emotional reasoning.   Shaune Pollack, LPC

## 2014-07-02 ENCOUNTER — Other Ambulatory Visit (HOSPITAL_COMMUNITY): Payer: PRIVATE HEALTH INSURANCE | Admitting: Psychiatry

## 2014-07-02 DIAGNOSIS — F331 Major depressive disorder, recurrent, moderate: Secondary | ICD-10-CM | POA: Diagnosis not present

## 2014-07-02 NOTE — Progress Notes (Signed)
    Daily Group Progress Note  Program: IOP Group Time: 9:00-10:30  Participation Level: Active  Behavioral Response: Appropriate  Type of Therapy:  Group Therapy  Summary of Progress: Pt. Presented as less lethargic, talkative, makes appropriate eye contact. Pt. Reports that she celebrated daughter's end of school with family. Pt. Reports concerns about increased appetite which she attributes to medication. Pt. Participated in discussion about modifying food choices and environment to assist with lowering caloric intake to avoid weight gain.     Group Time: 10:30-12:00  Participation Level:  Active  Behavioral Response: Appropriate  Type of Therapy: Psycho-education Group  Summary of Progress: Pt. Participated in grief and loss group facilitated by Theda Belfast.   Shaune Pollack, LPC

## 2014-07-02 NOTE — Telephone Encounter (Signed)
D:  Placed call to [facility] [phone]) spoke to [place].  Informed him that pt was admitted in [facility] on 06-23-14 with possible discharge 07-16-14.  F/U appointment with therapist (07-22-14).

## 2014-07-05 ENCOUNTER — Other Ambulatory Visit (HOSPITAL_COMMUNITY): Payer: PRIVATE HEALTH INSURANCE | Admitting: Psychiatry

## 2014-07-05 DIAGNOSIS — F331 Major depressive disorder, recurrent, moderate: Secondary | ICD-10-CM | POA: Diagnosis not present

## 2014-07-06 ENCOUNTER — Telehealth (HOSPITAL_COMMUNITY): Payer: Self-pay | Admitting: Psychiatry

## 2014-07-06 ENCOUNTER — Other Ambulatory Visit (HOSPITAL_COMMUNITY): Payer: PRIVATE HEALTH INSURANCE | Admitting: Psychiatry

## 2014-07-07 ENCOUNTER — Other Ambulatory Visit (HOSPITAL_COMMUNITY): Payer: PRIVATE HEALTH INSURANCE | Admitting: Psychiatry

## 2014-07-07 DIAGNOSIS — F331 Major depressive disorder, recurrent, moderate: Secondary | ICD-10-CM | POA: Diagnosis not present

## 2014-07-07 NOTE — Progress Notes (Signed)
    Daily Group Progress Note  Program: IOP  Group Time: 9:00-10:30  Participation Level: Active  Behavioral Response: Appropriate  Type of Therapy:  Group Therapy  Summary of Progress: Pt. Presented as talkative, made appropriate eye contact. Pt. Presented as somewhat lethargic. Pt. Reported that she was active over the weekend, but began to feel tired and felt some guilt for not taking daughter to a party. Pt. Reported difficult conversation with her mother and pattern of not feeling supported in that relationship.      Group Time: 10:30-12:00  Participation Level:  Active  Behavioral Response: Appropriate  Type of Therapy: Psycho-education Group  Summary of Progress: Pt. Participated in guided meditation exercise and review of grounding sequence.   Shaune Pollack, LPC

## 2014-07-07 NOTE — Progress Notes (Signed)
    Daily Group Progress Note  Program: IOP  Group Time: 9:00-10:30  Participation Level: Active  Behavioral Response: Appropriate  Type of Therapy:  Group Therapy  Summary of Progress: Pt. Presented as talkative, mildly anxious. Pt. Discussed challenge of guilt and shame related to anxiety that has kept her from visiting her brother in [place]. Pt. Was encouraged by the group to address her anxiety in small steps, to consider seeking support from family members, and to consider taking the train to [facility] to connect with her brother.      Group Time: 10:30-12:00  Participation Level:  Active  Behavioral Response: Appropriate  Type of Therapy: Psycho-education Group  Summary of Progress: Pt. Discussed the need for healthy boundaries in family relationships.   Shaune Pollack, LPC

## 2014-07-08 ENCOUNTER — Other Ambulatory Visit (HOSPITAL_COMMUNITY): Payer: PRIVATE HEALTH INSURANCE | Admitting: Psychiatry

## 2014-07-09 ENCOUNTER — Other Ambulatory Visit (HOSPITAL_COMMUNITY): Payer: PRIVATE HEALTH INSURANCE | Admitting: Psychiatry

## 2014-07-09 DIAGNOSIS — F331 Major depressive disorder, recurrent, moderate: Secondary | ICD-10-CM | POA: Diagnosis not present

## 2014-07-09 NOTE — Patient Instructions (Signed)
Pt completed MH-IOP today.  Will follow up with Dr. Lolly Mustache on 07-13-14 @ 1:15 pm and Vernell Leep, [facility] within two weeks.  Encouraged support groups.  Return to work on 07-19-14, without any restrictions.

## 2014-07-09 NOTE — Progress Notes (Signed)
    Daily Group Progress Note  Program: IOP  Group Time: 9:00-11:00  Participation Level: Active  Behavioral Response: Appropriate  Type of Therapy:  Group Therapy  Summary of Progress: Pt. Watched and discussed Haze Rushing video about developing self-compassion. Pt. Prepared for discharge and stated that she believed that she is ready. Pt. Continues to work on developing healthy contact boundaries with family and managing anxiety while driving.      Group Time: 11:00-12:00  Participation Level:  Active  Behavioral Response: Appropriate  Type of Therapy: Psycho-education Group  Summary of Progress: Pt. Participated in grief and loss group with Theda Belfast.   Shaune Pollack, LPC
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
## 2014-07-12 ENCOUNTER — Other Ambulatory Visit (HOSPITAL_COMMUNITY): Payer: PRIVATE HEALTH INSURANCE | Admitting: Psychiatry

## 2014-07-13 ENCOUNTER — Ambulatory Visit (HOSPITAL_COMMUNITY): Payer: Self-pay | Admitting: Psychiatry

## 2014-07-13 ENCOUNTER — Telehealth (HOSPITAL_COMMUNITY): Payer: Self-pay | Admitting: Psychiatry

## 2014-07-13 ENCOUNTER — Other Ambulatory Visit (HOSPITAL_COMMUNITY): Payer: PRIVATE HEALTH INSURANCE

## 2014-07-13 NOTE — Progress Notes (Signed)
Patient ID: Melinda Lawrence, female   DOB: October 17, 1980, 34 y.o.   MRN: [id number] Discharge Note  Patient:  Melinda Lawrence is an 34 y.o., female DOB:  [birth year]  Date of Admission:  06/23/2014 Date of Discharge:  07/09/2014  Reason for Admission:depression and anxiety  IOP Course:   [name] attended groups and participated.  Her anxiety and depression lessened but were still present.  Her main stressor was her job and feeling pressured to perform with increased micro-management she reports.  This anxiety is what brought her back after her earlier stay in IOP in February.  She has not found another job.  Mental Status at Discharge:not suicidal Still anxious and depressed but willing to return to work   Lab Results: No results found for this or any previous visit (from the past 48 hour(s)).   Current outpatient prescriptions:  .  ARIPiprazole (ABILIFY) 5 MG tablet, Take 1 tablet (5 mg total) by mouth daily., Disp: 30 tablet, Rfl: 0 .  buPROPion (WELLBUTRIN XL) 300 MG 24 hr tablet, Take 1 tablet (300 mg total) by mouth daily. Take 300 mg daily, Disp: 30 tablet, Rfl: 0 .  clonazePAM (KLONOPIN) 1 MG tablet, Take 1 mg by mouth 2 (two) times daily., Disp: , Rfl:  .  NUVARING 0.12-0.015 MG/24HR vaginal ring, , Disp: , Rfl:  .  omeprazole (PRILOSEC OTC) 20 MG tablet, Take 20 mg by mouth., Disp: , Rfl:  .  traZODone (DESYREL) 100 MG tablet, Take 1 tablet (100 mg total) by mouth at bedtime., Disp: 30 tablet, Rfl: 0  Axis Diagnosis:  Major depression recurrent moderate.  Generalized anxiety disorder   Level of Care:  IOP  Discharge destination:  Other:  follow up with psychiatrist and therapist and appointments have been made  Is patient on multiple antipsychotic therapies at discharge:  No    Has Patient had three or more failed trials of antipsychotic monotherapy by history:  Negative  Patient phone:  [phone] (home)  Patient address:   [address] [place]  [address],   Follow-up recommendations:  Activity:  continue usual activity Diet:  continue usual diet  Comments:  IOP was helpful but she will not likely be better unless her work changes  The patient received suicide prevention pamphlet:  Yes Belongings returned:  na  Bobbiejo Ishikawa D 07/13/2014, 8:28 AM

## 2014-07-13 NOTE — Telephone Encounter (Signed)
D:  Jamie from [employer] phoned inquiring about patient's return to work and needing chart information sent to them.  A:  Faxed discharged progress notes; which stated pt's return to work as 07-19-14; without any restrictions.  Inform Dr. Lolly Mustache.

## 2014-07-14 ENCOUNTER — Other Ambulatory Visit (HOSPITAL_COMMUNITY): Payer: PRIVATE HEALTH INSURANCE

## 2014-07-19 ENCOUNTER — Other Ambulatory Visit (HOSPITAL_COMMUNITY): Payer: Self-pay | Admitting: Psychiatry

## 2014-07-19 DIAGNOSIS — F331 Major depressive disorder, recurrent, moderate: Secondary | ICD-10-CM

## 2014-07-19 DIAGNOSIS — F3131 Bipolar disorder, current episode depressed, mild: Secondary | ICD-10-CM

## 2014-07-20 MED ORDER — ARIPIPRAZOLE 5 MG PO TABS: 5.0000 mg | ORAL_TABLET | Freq: Every day | ORAL | Status: DC

## 2014-07-20 MED ORDER — BUPROPION HCL ER (XL) 300 MG PO TB24: 300.0000 mg | ORAL_TABLET | Freq: Every day | ORAL | Status: DC

## 2014-07-20 NOTE — Telephone Encounter (Signed)
Telephone call with patient 3 times this date and with Dr. Lolly MustacheArfeen twice to discuss patient's status with ending IOP and needing an appointment to return to see Dr. Lolly MustacheArfeen.  Patient stated she could not come in on any other day except a Monday and there are no appointments on Dr. Sheela StackArfeen's schedule until August.  Patient requested to be placed on the call back list for Mondays and informed Dr. Lolly MustacheArfeen authorized a one time refill of her Ability and Wellbutrin but not her Klonopin as he did not start this medication and would need to see patient before taking over its continued use.  Patient stated her PCP wanted Dr. Lolly MustacheArfeen to take over writing this and informed, per Dr. Lolly MustacheArfeen should could discuss this with him when she sees him the next time but at present patient would have to go back through her PCP for Klonopin medication.  Patient was not happy with this but agreed to follow up with PCP.  New Abilify and Wellbutrin one time orders [facility] [address]

## 2014-07-22 ENCOUNTER — Ambulatory Visit (HOSPITAL_COMMUNITY): Payer: Self-pay | Admitting: Psychology

## 2014-07-23 ENCOUNTER — Ambulatory Visit (INDEPENDENT_AMBULATORY_CARE_PROVIDER_SITE_OTHER): Payer: 59 | Admitting: Psychiatry

## 2014-07-23 ENCOUNTER — Encounter (HOSPITAL_COMMUNITY): Payer: Self-pay | Admitting: Psychiatry

## 2014-07-23 VITALS — BP 116/78 | HR 92 | Ht 62.75 in | Wt 181.4 lb

## 2014-07-23 DIAGNOSIS — F3132 Bipolar disorder, current episode depressed, moderate: Secondary | ICD-10-CM | POA: Diagnosis not present

## 2014-07-23 DIAGNOSIS — F1010 Alcohol abuse, uncomplicated: Secondary | ICD-10-CM | POA: Diagnosis not present

## 2014-07-23 DIAGNOSIS — F331 Major depressive disorder, recurrent, moderate: Secondary | ICD-10-CM

## 2014-07-23 MED ORDER — ARIPIPRAZOLE 10 MG PO TABS: 10.0000 mg | ORAL_TABLET | Freq: Every day | ORAL | Status: DC

## 2014-07-23 NOTE — Progress Notes (Signed)
[facility] progress Note  07/23/2014 11:18 AM ESTHEFANY HERRIG  MRN:  [id number]   Subjective:   I still feel sad depressed and anxious.  I finished the program.  I started working but I'm very nervous and anxious.  I don't think I can work every day.  I am not drinking alcohol.  I need Klonopin  History Of Presenting Illness; Patient came for her follow-up appointment.  She recently finished intensive outpatient program .  She did not see a huge improvement as she continued to endorse depression, crying spells, irritability and insomnia.  She denies any self abusive behaviors or cutting herself.  She denies any mania or psychosis.  She feels very nervous and anxious at work.  She started last Monday .  She wanted to complete job accommodation forms because her FMLA is ran out .  Patient is working in [employer].  There were no new medication added and intensive outpatient program.  She is taking Klonopin which is prescribed by her primary care physician.  She is taking Abilify, Wellbutrin and trazodone.  We tried Lamictal which helped her but she developed rash.  Her rash is now resolved.  Patient complaining of crying spells, she appeared sad depressed and sobbing because of the stress at work.  She does not feel Legrand Pitts help and now she is scheduled to see a different therapist Benedetto Coons for counseling.  Patient is concerned about her weight gain.  She has gained 10 pounds in past 2 months.  She believe the Neurontin causing the weight gain.  Patient denies any hallucination, paranoia but admitted anxiety and nervousness.  She does not feel comfortable around people.  However she feel Klonopin helping her anxiety .  She has no tremors or shakes.  She is no longer taking pain medication.  She endorse stop drinking since her last birthday.  She is not using any illegal substances.  Patient appears very stressed about her job.  She liked to keep her job but sometime she feels unable to  function at work.  Diagnosis:   Patient Active Problem List   Diagnosis Date Noted  . Panic disorder without agoraphobia [F41.0] 03/25/2014  . Major depression, recurrent [F33.9] 03/02/2014  . Low grade squamous intraepithelial lesion (LGSIL) on Papanicolaou smear of cervix [R87.612] 06/19/2013   Total Time spent with patient: 20 minutes   Past Medical History:  Patient endorse she has chronic back pain and bulging disc. She is taking narcotic pain medication from her primary care physician. Her physician is Dierdre Forth at [facility] family physician.  Past Medical History  Diagnosis Date  . Thyroid disease   . Anemia   . Ovarian cyst   . Anxiety   . Depression   . Back pain 04/27/14    Will be having a steroid injection on 05/11/14.   No past surgical history on file. Family History:  Family History  Problem Relation Age of Onset  . Cancer Mother   . Diabetes Father   . Depression Maternal Grandmother    Social History:  Patient lives with HER-2 children who are 77 and 50 year old.  Both of her children are from 2 different relationship. History  Alcohol Use No     History  Drug Use No    History   Social History  . Marital Status: Single    Spouse Name: N/A  . Number of Children: N/A  . Years of Education: N/A   Social History Main Topics  .  Smoking status: Current Every Day Smoker -- 0.50 packs/day    Types: Cigarettes  . Smokeless tobacco: Never Used  . Alcohol Use: No  . Drug Use: No  . Sexual Activity:    Partners: Female, Female    Birth Control/ Protection: Inserts   Other Topics Concern  . None   Social History Narrative   Psychiatric History; Patient recently finished and completed intensive outpatient program.  Patient endorse history of depression, mood swing and anxiety since her teens. She admitted history of cutting herself and she has one overdose when she was in her teens. She started seeing psychiatrist in 2006 due to severe  depression . She remembered at that time having a relationship problem and using drugs. She was admitted in [place] for alcohol abuse. She remember impulsive behavior by using excessive buying shopping for no reason. She had tried Seroquel which makes her sleepy lithium which make her zombie and Zoloft which did not help her. She denies any history of psychosis or any hallucination. She denies any history of physical, emotional and verbal abuse in the past.  He had tried Lamictal which helped her but she developed a rash.  Musculoskeletal: Strength & Muscle Tone: within normal limits Gait & Station: normal Patient leans: N/A   Psychiatric Specialty Exam: Physical Exam  Review of Systems  Constitutional: Positive for malaise/fatigue. Negative for weight loss.  Eyes: Negative for blurred vision.  Cardiovascular: Negative for chest pain.  Musculoskeletal: Negative.   Skin: Negative for itching and rash.       Rash resolved  Neurological: Negative for dizziness and tremors.  Psychiatric/Behavioral: Positive for depression. The patient is nervous/anxious and has insomnia.     Blood pressure 116/78, pulse 92, height 5' 2.75" (1.594 m), weight 181 lb 6.4 oz (82.283 kg).Body mass index is 32.38 kg/(m^2).  General Appearance: Casual  Eye Contact::  Fair  Speech:  Slow  Volume:  Decreased  Mood:  Anxious, Depressed and Dysphoric  Affect:  Constricted and Depressed  Thought Process:  Linear and Logical  Orientation:  Full (Time, Place, and Person)  Thought Content:  Rumination  Suicidal Thoughts:  No  Homicidal Thoughts:  No  Memory:  Immediate;   Fair Recent;   Fair Remote;   Fair  Judgement:  Fair  Insight:  Fair  Psychomotor Activity:  Decreased  Concentration:  Fair  Recall:  [employer] of Knowledge:Fair  Language: Fair  Akathisia:  No  Handed:  Right  AIMS (if indicated):     Assets:  Communication Skills Desire for Improvement Financial  Resources/Insurance Housing Physical Health Social Support  ADL's:  Intact  Cognition: WNL  Sleep:        Current Medications: Current Outpatient Prescriptions  Medication Sig Dispense Refill  . ARIPiprazole (ABILIFY) 10 MG tablet Take 1 tablet (10 mg total) by mouth daily. 30 tablet 0  . buPROPion (WELLBUTRIN XL) 300 MG 24 hr tablet Take 1 tablet (300 mg total) by mouth daily. Take 300 mg daily 30 tablet 0  . clonazePAM (KLONOPIN) 1 MG tablet Take 1 mg by mouth 2 (two) times daily.    Marland Kitchen NUVARING 0.12-0.015 MG/24HR vaginal ring     . omeprazole (PRILOSEC OTC) 20 MG tablet Take 20 mg by mouth.    . traZODone (DESYREL) 100 MG tablet Take 1 tablet (100 mg total) by mouth at bedtime. 30 tablet 0   No current facility-administered medications for this visit.    Lab Results: No results found for this  or any previous visit (from the past 48 hour(s)).  Medical Decision Making:  New problem, with additional work up planned, Review of Psycho-Social Stressors (1), Established Problem, Worsening (2), Review of Last Therapy Session (1), Review of Medication Regimen & Side Effects (2) and Review of New Medication or Change in Dosage (2)  Assessment: Major depressive disorder , recurrent moderate.  Bipolar disorder depressed type.  Alcohol abuse .  Plan I review her symptoms, collateral information from intensive outpatient program, current medication and psychosocial stressors.  Patient is still have a lot of symptoms of depression and anxiety symptoms.  Recommended to increase Abilify 10 mg daily .  She will continue trazodone and Wellbutrin at present dose.  She has no tremors or shakes..She is going to start therapy with a new therapist Jackelyn Hoehn .  She is taking Klonopin which is prescribed by her primary care physician.  I recommended she should continue the Klonopin from her primary care physician since we have not started benzodiazepine.  However if she prefer non-benzo medication we can  Effexor or hydroxyzine.  However patient like to continue Klonopin and she will contact her primary care physician.  We will complete her job accommodation forms.  She has taken in the past lithium, Seroquel with limited help.  If she continues to have symptoms then we will consider switching to Tegretol in the future.  Discuss safety plan that anytime having active suicidal thoughts or homicidal thought that she need to call 911 or go to the local emergency room.  I will see him again in 4 weeks.    Dossie Swor T. 07/23/2014, 11:18 AM

## 2014-07-23 NOTE — Telephone Encounter (Signed)
A:  Completed and faxed ADA Job Accommodation form for patient.  Dr. Lolly MustacheArfeen is recommending that pt be allowed two days (16 hrs) off per month in order to see her providers (psychiatrist and therapist).  He is also requesting that pt is excused 05-19-14, 05-21-14, 05-22-14, 06-22-14, 07-23-14 and ongoing due to appts.  Informed pt that the forms have been faxed.  R:  Pt receptive.

## 2014-07-30 ENCOUNTER — Telehealth (HOSPITAL_COMMUNITY): Payer: Self-pay

## 2014-08-14 ENCOUNTER — Other Ambulatory Visit (HOSPITAL_COMMUNITY): Payer: Self-pay | Admitting: Psychiatry

## 2014-08-20 ENCOUNTER — Other Ambulatory Visit (HOSPITAL_COMMUNITY): Payer: Self-pay | Admitting: Psychiatry

## 2014-08-20 DIAGNOSIS — F331 Major depressive disorder, recurrent, moderate: Secondary | ICD-10-CM

## 2014-08-20 NOTE — Telephone Encounter (Signed)
Dose increased on her last visit.  Refill not appropriate.

## 2014-08-24 ENCOUNTER — Encounter (HOSPITAL_COMMUNITY): Payer: Self-pay | Admitting: Psychology

## 2014-08-24 DIAGNOSIS — F331 Major depressive disorder, recurrent, moderate: Secondary | ICD-10-CM

## 2014-08-24 NOTE — Progress Notes (Signed)
Melinda Lawrence is a 34 y.o. female patient discharged from counseling as seeking services from another provider.  Outpatient Therapist Discharge Summary  TOLULOPE PINKETT    [birth year]   Admission Date: 03/25/14   Discharge Date:  08/24/14 Reason for Discharge:  Pt Transferred to an external provider for counseling Diagnosis:   Major depressive disorder, recurrent episode, moderate    Comments:  Pt will continue as scheduled w/ Dr. Epimenio Foot Harrison Mons, [facility]

## 2014-08-31 ENCOUNTER — Other Ambulatory Visit (HOSPITAL_COMMUNITY): Payer: Self-pay | Admitting: Psychiatry

## 2014-08-31 DIAGNOSIS — F331 Major depressive disorder, recurrent, moderate: Secondary | ICD-10-CM

## 2014-08-31 MED ORDER — ARIPIPRAZOLE 10 MG PO TABS: 10.0000 mg | ORAL_TABLET | Freq: Every day | ORAL | Status: DC

## 2014-09-01 ENCOUNTER — Ambulatory Visit (HOSPITAL_COMMUNITY): Payer: Self-pay | Admitting: Psychiatry

## 2014-09-08 ENCOUNTER — Ambulatory Visit (HOSPITAL_COMMUNITY): Payer: Self-pay | Admitting: Psychiatry

## 2014-09-17 ENCOUNTER — Telehealth (HOSPITAL_COMMUNITY): Payer: Self-pay

## 2014-09-17 NOTE — Telephone Encounter (Signed)
Medication management - Called [employer] to complete a prior authorization needed on patient's prescribed Abilify , one daily.  Prior authorization completed and approved; PA # [id number] from 09/17/14 - 08/23/15.  Jaymes Graff, [facility]ist at [facility] [address] to inform PA was completed and Abilify was approved at 36m a day. Pharmacist stated they would fill the medication and contact patient.

## 2014-09-22 ENCOUNTER — Ambulatory Visit (INDEPENDENT_AMBULATORY_CARE_PROVIDER_SITE_OTHER): Payer: Medicaid Other | Admitting: Psychiatry

## 2014-09-22 ENCOUNTER — Encounter (HOSPITAL_COMMUNITY): Payer: Self-pay | Admitting: Psychiatry

## 2014-09-22 VITALS — BP 130/86 | HR 102 | Ht 62.75 in | Wt 186.2 lb

## 2014-09-22 DIAGNOSIS — F3131 Bipolar disorder, current episode depressed, mild: Secondary | ICD-10-CM

## 2014-09-22 DIAGNOSIS — F1010 Alcohol abuse, uncomplicated: Secondary | ICD-10-CM

## 2014-09-22 DIAGNOSIS — F3132 Bipolar disorder, current episode depressed, moderate: Secondary | ICD-10-CM

## 2014-09-22 MED ORDER — CARBAMAZEPINE ER 100 MG PO TB12: 100.0000 mg | ORAL_TABLET | Freq: Two times a day (BID) | ORAL | Status: DC

## 2014-09-22 MED ORDER — BUPROPION HCL ER (XL) 300 MG PO TB24: 300.0000 mg | ORAL_TABLET | Freq: Every day | ORAL | Status: AC

## 2014-09-22 MED ORDER — TRAZODONE HCL 100 MG PO TABS: 100.0000 mg | ORAL_TABLET | Freq: Every day | ORAL | Status: AC

## 2014-09-22 NOTE — Progress Notes (Signed)
[place] progress Note  09/22/2014 3:47 PM Melinda Lawrence  MRN:  [id number]   Subjective:   I lost my job because I could not continue to work.  I have too many absences.  I lost my insurance however I got Medicaid.  I just started taking medication few days ago.    History Of Presenting Illness; Melinda Lawrence came for her follow-up appointment.  She could not afford medication because she lost her job and insurance.  Recently she approved for Medicaid and a start taking medication 1 week ago.  She is complaining of poor sleep, crying spells, depression and anhedonia.  She admitted passive and fleeting suicidal thoughts but no plan or intent.  She told that she went to [place] with her friends and family to relax and admitted that she has drink alcohol but she minimizes the intake.  She denies it was binge or any intoxication.  She like to go back on medication to take consistent and now she started seeing can Mare Ferrari for counseling.  She endorse financial distress but getting some help from her family members and also she is playing piano in church.  She is taking Klonopin which is prescribed by her primary care physician.  On her last visit we have recommended to increase Abilify but she has noticed increase restlessness, tremors and shakes.  Initially she thought gabapentin is causing the shakes and her primary care physician has cut down the gabapentin.  However she is complaining of increased pain and tremors getting worse.  She sleeping on and off.  She denies any hallucination but endorsed mood swing, irritability, highs and lows.  She is not involved in any self abusive behavior.  She is trying to lose weight however she has noticed more weight gain and she is not happy about it.  She admitted paranoia and does not feel comfortable around people.  She denies any illegal substance use.  She lives with her 2 children.  Diagnosis:   Patient Active Problem List   Diagnosis Date Noted   . Panic disorder without agoraphobia [F41.0] 03/25/2014  . Major depression, recurrent [F33.9] 03/02/2014  . Low grade squamous intraepithelial lesion (LGSIL) on Papanicolaou smear of cervix [R87.612] 06/19/2013   Total Time spent with patient: 30 minutes   Past Medical History:  Patient endorse she has chronic back pain and bulging disc. She is taking narcotic pain medication from her primary care physician. Her physician is Dierdre Forth at [facility] family physician.  Past Medical History  Diagnosis Date  . Thyroid disease   . Anemia   . Ovarian cyst   . Anxiety   . Depression   . Back pain 04/27/14    Will be having a steroid injection on 05/11/14.   History reviewed. No pertinent past surgical history. Family History:  Family History  Problem Relation Age of Onset  . Cancer Mother   . Diabetes Father   . Depression Maternal Grandmother    Social History:  Patient lives with HER-2 children who are 28 and 43 year old.  Both of her children are from 2 different relationship. History  Alcohol Use No     History  Drug Use No    Social History   Social History  . Marital Status: Single    Spouse Name: N/A  . Number of Children: N/A  . Years of Education: N/A   Social History Main Topics  . Smoking status: Current Every Day Smoker -- 0.25 packs/day    Types:  Cigarettes  . Smokeless tobacco: Never Used  . Alcohol Use: No  . Drug Use: No  . Sexual Activity:    Partners: Female, Female    Birth Control/ Protection: Inserts   Other Topics Concern  . None   Social History Narrative   Psychiatric History; Patient recently finished and completed intensive outpatient program.  Patient endorse history of depression, mood swing and anxiety since her teens. She admitted history of cutting herself and she has one overdose when she was in her teens. She started seeing psychiatrist in 2006 due to severe depression . She remembered at that time having a relationship  problem and using drugs. She was admitted in [place] for alcohol abuse. She remember impulsive behavior by using excessive buying shopping for no reason. She had tried Seroquel which makes her sleepy lithium which make her zombie and Zoloft which did not help her. She denies any history of psychosis or any hallucination. She denies any history of physical, emotional and verbal abuse in the past.  He had tried Lamictal which helped her but she developed a rash.  Musculoskeletal: Strength & Muscle Tone: within normal limits Gait & Station: normal Patient leans: N/A   Psychiatric Specialty Exam: Physical Exam  Review of Systems  Constitutional: Positive for malaise/fatigue. Negative for weight loss.  Eyes: Negative for blurred vision.  Cardiovascular: Negative for chest pain.  Musculoskeletal: Negative.   Skin: Negative for itching and rash.  Neurological: Positive for tremors. Negative for dizziness.  Psychiatric/Behavioral: Positive for depression. The patient is nervous/anxious and has insomnia.     Blood pressure 130/86, pulse 102, height 5' 2.75" (1.594 m), weight 186 lb 3.2 oz (84.46 kg).Body mass index is 33.24 kg/(m^2).  General Appearance: Casual  Eye Contact::  Fair  Speech:  Slow  Volume:  Decreased  Mood:  Anxious and Depressed  Affect:  Constricted and Depressed  Thought Process:  Linear and Logical  Orientation:  Full (Time, Place, and Person)  Thought Content:  Rumination  Suicidal Thoughts:  Yes.  without intent/plan passive and fleeting thoughts with no intent and plan   Homicidal Thoughts:  No  Memory:  Immediate;   Fair Recent;   Fair Remote;   Fair  Judgement:  Fair  Insight:  Fair  Psychomotor Activity:  Decreased  Concentration:  Fair  Recall:  [employer] of Knowledge:Fair  Language: Fair  Akathisia:  No  Handed:  Right  AIMS (if indicated):     Assets:  Communication Skills Desire for Improvement Financial Resources/Insurance Housing Physical  Health Social Support  ADL's:  Intact  Cognition: WNL  Sleep:        Current Medications: Current Outpatient Prescriptions  Medication Sig Dispense Refill  . ARIPiprazole (ABILIFY) 10 MG tablet Take 1 tablet (10 mg total) by mouth daily. 30 tablet 0  . buPROPion (WELLBUTRIN XL) 300 MG 24 hr tablet Take 1 tablet (300 mg total) by mouth daily. Take 300 mg daily 30 tablet 0  . clonazePAM (KLONOPIN) 1 MG tablet Take 1 mg by mouth 2 (two) times daily.    Marland Kitchen NUVARING 0.12-0.015 MG/24HR vaginal ring     . carbamazepine (TEGRETOL-XR) 100 MG 12 hr tablet Take 1 tablet (100 mg total) by mouth 2 (two) times daily. 60 tablet 0  . omeprazole (PRILOSEC OTC) 20 MG tablet Take 20 mg by mouth.    . traZODone (DESYREL) 100 MG tablet Take 1 tablet (100 mg total) by mouth at bedtime. 30 tablet 0   No  current facility-administered medications for this visit.    Lab Results: No results found for this or any previous visit (from the past 48 hour(s)).  Medical Decision Making:  Review of Psycho-Social Stressors (1), Review and summation of old records (2), Established Problem, Worsening (2), Review of Last Therapy Session (1), Review of Medication Regimen & Side Effects (2) and Review of New Medication or Change in Dosage (2)  Assessment: Major depressive disorder , recurrent moderate.  Bipolar disorder depressed type.  Alcohol abuse .  Plan I review her medication, symptoms, psychosocial issues and collateral information.  Patient may have akathisia due to increase Abilify.  Recommended to decrease Abilify 5 mg per 1 week and discontinue.  We will try Tegretol 100 mg twice a day.  Continue trazodone and Wellbutrin at present dose.  She is getting Klonopin from her primary care physician.  We talked about alcohol abuse in detail.  Discussed continued use of alcohol may cause interaction with the medication and also delayed recovery into her illness.  Patient promised that she will remain abstain from alcohol.   Encouraged to keep appointment with Carlynn Spry for counseling.  In the past she had tried Lamictal causing rash but she remember it did help a lot.  She has also taken lithium and Seroquel in the past with limited help.  Discuss safety plan that anytime having active suicidal thoughts or homicidal thought that she need to call 911 over the local emergency room.  I will see her again in 2 weeks.   Samadhi Mahurin T. 09/22/2014, 3:47 PM

## 2014-10-07 ENCOUNTER — Other Ambulatory Visit (HOSPITAL_COMMUNITY): Payer: Self-pay | Admitting: Psychiatry

## 2014-10-07 ENCOUNTER — Ambulatory Visit (INDEPENDENT_AMBULATORY_CARE_PROVIDER_SITE_OTHER): Payer: 59 | Admitting: Psychiatry

## 2014-10-07 ENCOUNTER — Encounter (HOSPITAL_COMMUNITY): Payer: Self-pay | Admitting: Psychiatry

## 2014-10-07 VITALS — BP 130/84 | HR 93 | Ht 63.0 in | Wt 182.2 lb

## 2014-10-07 DIAGNOSIS — F3132 Bipolar disorder, current episode depressed, moderate: Secondary | ICD-10-CM

## 2014-10-07 DIAGNOSIS — F1010 Alcohol abuse, uncomplicated: Secondary | ICD-10-CM

## 2014-10-07 DIAGNOSIS — F3131 Bipolar disorder, current episode depressed, mild: Secondary | ICD-10-CM

## 2014-10-07 MED ORDER — CARBAMAZEPINE ER 200 MG PO TB12: 200.0000 mg | ORAL_TABLET | Freq: Two times a day (BID) | ORAL | Status: DC

## 2014-10-07 NOTE — Progress Notes (Signed)
[place] progress Note  10/07/2014 11:07 AM [place] [place]  MRN:  [id number]   Subjective:   I like new medication.  I tremors are getting better.  I was able to lose some weight.  However I still feel anxious and nervous.  I still have mood swings.  I stopped drinking.   History Of Presenting Illness; Ritaj came for her follow-up appointment.  We started her on Tegretol and she is taking 100 mg twice a day.  She is no longer taking Abilify.  She has noticed improvement in her mood and depression .  However she still feel anxious, nervous, having irritability and mood swings.  She is not taking Klonopin.  She had stopped drinking alcohol and she feels proud of it.  She is not taking trazodone every night because sometime it makes her very sleepy and groggy.  She is unable to see her therapist because her therapist does not take Medicaid.  She does not want to go back to [facility] however like to see someone else in this office.  She continued to endorse financial distress and she is getting help from the family.  She still have shakes however it is less intense and less frequent since Abilify is stopped.  She is controlling her appetite and she is able to lose some weight.  Her energy level is improved.  She's no longer taking gabapentin.  She denies any suicidal thoughts but she continued to endorse some time paranoia.  She denies any self abusive behavior.  She admitted not comfortable around people but denies any homicidal thoughts.  She lives with HER-2 children.  Diagnosis:   Patient Active Problem List   Diagnosis Date Noted  . Panic disorder without agoraphobia [F41.0] 03/25/2014  . Major depression, recurrent [F33.9] 03/02/2014  . Low grade squamous intraepithelial lesion (LGSIL) on Papanicolaou smear of cervix [R87.612] 06/19/2013   Total Time spent with patient: 30 minutes   Past Medical History:  Patient endorse she has chronic back pain and bulging disc.  She is taking narcotic pain medication from her primary care physician. Her physician is Dierdre Forth at [facility] family physician.  Past Medical History  Diagnosis Date  . Thyroid disease   . Anemia   . Ovarian cyst   . Anxiety   . Depression   . Back pain 04/27/14    Will be having a steroid injection on 05/11/14.   History reviewed. No pertinent past surgical history. Family History:  Family History  Problem Relation Age of Onset  . Cancer Mother   . Diabetes Father   . Depression Maternal Grandmother    Social History:  Patient lives with HER-2 children who are 72 and 67 year old.  Both of her children are from 2 different relationship. History  Alcohol Use No     History  Drug Use No    Social History   Social History  . Marital Status: Single    Spouse Name: N/A  . Number of Children: N/A  . Years of Education: N/A   Social History Main Topics  . Smoking status: Current Every Day Smoker -- 0.25 packs/day    Types: Cigarettes  . Smokeless tobacco: Never Used  . Alcohol Use: No  . Drug Use: No  . Sexual Activity:    Partners: Female, Female    Birth Control/ Protection: Inserts   Other Topics Concern  . None   Social History Narrative   Psychiatric History; Patient recently finished and completed  intensive outpatient program.  Patient endorse history of depression, mood swing and anxiety since her teens. She admitted history of cutting herself and she has one overdose when she was in her teens. She started seeing psychiatrist in 2006 due to severe depression . She remembered at that time having a relationship problem and using drugs. She was admitted in [place] for alcohol abuse. She remember impulsive behavior by using excessive buying shopping for no reason. She had tried Seroquel which makes her sleepy lithium which make her zombie and Zoloft which did not help her. She denies any history of psychosis or any hallucination. She denies any history of  physical, emotional and verbal abuse in the past.  He had tried Lamictal which helped her but she developed a rash.  Recently we tried Abilify however she started to have shakes.  Musculoskeletal: Strength & Muscle Tone: within normal limits Gait & Station: normal Patient leans: N/A   Psychiatric Specialty Exam: Physical Exam  Review of Systems  Constitutional: Positive for malaise/fatigue. Negative for weight loss.  Eyes: Negative for blurred vision.  Cardiovascular: Negative for chest pain.  Musculoskeletal: Negative.   Skin: Negative for itching and rash.  Neurological: Positive for tremors. Negative for dizziness.  Psychiatric/Behavioral: Positive for depression. The patient is nervous/anxious and has insomnia.     Blood pressure 130/84, pulse 93, height _0  (1.6 m), weight 182 lb 3.2 oz (82.645 kg).Body mass index is 32.28 kg/(m^2).  General Appearance: Casual  Eye Contact::  Fair  Speech:  Slow  Volume:  Decreased  Mood:  Anxious and Depressed  Affect:  Constricted  Thought Process:  Linear and Logical  Orientation:  Full (Time, Place, and Person)  Thought Content:  Rumination  Suicidal Thoughts:  No    Homicidal Thoughts:  No  Memory:  Immediate;   Fair Recent;   Fair Remote;   Fair  Judgement:  Fair  Insight:  Fair  Psychomotor Activity:  Decreased  Concentration:  Fair  Recall:  [employer] of Knowledge:Fair  Language: Fair  Akathisia:  No  Handed:  Right  AIMS (if indicated):     Assets:  Communication Skills Desire for Improvement Financial Resources/Insurance Housing Physical Health Social Support  ADL's:  Intact  Cognition: WNL  Sleep:        Current Medications: Current Outpatient Prescriptions  Medication Sig Dispense Refill  . carbamazepine (TEGRETOL XR) 200 MG 12 hr tablet Take 1 tablet (200 mg total) by mouth 2 (two) times daily. 60 tablet 0  . buPROPion (WELLBUTRIN XL) 300 MG 24 hr tablet Take 1 tablet (300 mg total) by mouth daily. Take  300 mg daily 30 tablet 0  . clonazePAM (KLONOPIN) 1 MG tablet Take 1 mg by mouth 2 (two) times daily.    Marland Kitchen NUVARING 0.12-0.015 MG/24HR vaginal ring     . omeprazole (PRILOSEC OTC) 20 MG tablet Take 20 mg by mouth.    . traZODone (DESYREL) 100 MG tablet Take 1 tablet (100 mg total) by mouth at bedtime. 30 tablet 0   No current facility-administered medications for this visit.    Lab Results: No results found for this or any previous visit (from the past 48 hour(s)).  Medical Decision Making:  Established Problem, Stable/Improving (1), Review of Psycho-Social Stressors (1), Review and summation of old records (2), Review of Last Therapy Session (1), Review of Medication Regimen & Side Effects (2) and Review of New Medication or Change in Dosage (2)  Assessment: Major depressive disorder ,  recurrent moderate.  Bipolar disorder depressed type.  Alcohol abuse .  Plan I review her medication, symptoms, psychosocial issues and collateral information.  Patient is not taking Abilify but she still had mild tremors and shakes.  She like Tegretol and I encouraged to take Tegretol 200 mg twice a day.  Recommended to take trazodone only as needed.  She is no longer taking Klonopin since her primary care physician did not continue.  I will schedule her appointment with a therapist in this office to see [facility] .  Discussed medication side effects and benefits.  She is able to lose weight and she is happy about it.  Discuss safety plan that anytime having active suicidal thoughts or homicidal thought that she need to call 911 over the local emergency room.  I will see her again in 4 weeks.   Karisa Nesser T. 10/07/2014, 11:07 AM

## 2014-10-08 ENCOUNTER — Other Ambulatory Visit (HOSPITAL_COMMUNITY): Payer: Self-pay | Admitting: Psychiatry

## 2014-10-08 NOTE — Telephone Encounter (Signed)
Not given from this office. She is not taking it.

## 2014-11-01 ENCOUNTER — Ambulatory Visit (HOSPITAL_COMMUNITY): Payer: 59 | Admitting: Psychiatry

## 2014-11-15 ENCOUNTER — Encounter: Payer: Self-pay | Admitting: *Deleted

## 2014-11-20 ENCOUNTER — Other Ambulatory Visit (HOSPITAL_COMMUNITY): Payer: Self-pay | Admitting: Psychiatry

## 2014-11-23 ENCOUNTER — Other Ambulatory Visit (HOSPITAL_COMMUNITY): Payer: Self-pay | Admitting: Psychiatry

## 2014-11-25 ENCOUNTER — Encounter: Payer: Self-pay | Admitting: Family Medicine

## 2014-11-29 ENCOUNTER — Other Ambulatory Visit (HOSPITAL_COMMUNITY): Payer: Self-pay | Admitting: *Deleted

## 2014-11-29 DIAGNOSIS — F3131 Bipolar disorder, current episode depressed, mild: Secondary | ICD-10-CM

## 2014-11-29 NOTE — Telephone Encounter (Signed)
Patient called stating Tegretol refill request was denied probably because of her not coming to her appt. Pt states she has a new job and is not able to come right now. She is requesting her Tegretol be refilled. Please advise

## 2014-12-01 ENCOUNTER — Other Ambulatory Visit (HOSPITAL_COMMUNITY): Payer: Self-pay | Admitting: Psychiatry

## 2014-12-01 ENCOUNTER — Telehealth (HOSPITAL_COMMUNITY): Payer: Self-pay | Admitting: *Deleted

## 2014-12-01 NOTE — Telephone Encounter (Signed)
Patient called requesting medication refill. Would like for Dr Lolly MustacheArfeen to call her. States she has a new job and is not able to be seen for the next 3 months. Pt states did not come to last appointment because dad had back surgery.

## 2014-12-01 NOTE — Telephone Encounter (Signed)
I returned phone call and left a message.  We are adjusting her medication and she need to be seen for her future refills.  She will need also a Tegretol level.

## 2014-12-06 ENCOUNTER — Other Ambulatory Visit (HOSPITAL_COMMUNITY): Payer: Self-pay | Admitting: Psychiatry

## 2014-12-08 NOTE — Telephone Encounter (Signed)
Telephone message left for patient our office had received a refill request for her Tegretol and Dr. Ladona Ridgelaylor had agreed to fill it one time in the absence of Dr. Lolly MustacheArfeen but patient would need to keep next scheduled evaluation with Dr. Lolly MustacheArfeen, set for 01/07/15 for further refills.  New one time refill order of patient's Carbamazepine e-scribed into [facility]'s [facility] [address] with instruction evaluation required for further refills.

## 2014-12-13 ENCOUNTER — Telehealth (HOSPITAL_COMMUNITY): Payer: Self-pay

## 2014-12-13 NOTE — Telephone Encounter (Signed)
Telephone call with [facility] at [facility] to follow up on prior authorization received for patient's Tegretol XR order who reported the medication did not need a prior authorization but should be filled as name brand which is currently the formula of choice under [facility].  [facility] [address] and spoke with pharmacist named Rosanne AshingJim as well who ran the order as instructed and reported it was now allowing them to fill the medication as instructed, name brand only formula.  Called patient to inform her Tegretol XR order was now being filled and she could pick up her needed prescription.

## 2014-12-15 ENCOUNTER — Other Ambulatory Visit (HOSPITAL_COMMUNITY)
Admission: RE | Admit: 2014-12-15 | Discharge: 2014-12-15 | Disposition: A | Discharge status: Home / Self Care | Payer: Medicaid Other | Source: Ambulatory Visit | Attending: Obstetrics & Gynecology | Admitting: Obstetrics & Gynecology

## 2014-12-15 ENCOUNTER — Ambulatory Visit (INDEPENDENT_AMBULATORY_CARE_PROVIDER_SITE_OTHER): Payer: Medicaid Other | Admitting: Family Medicine

## 2014-12-15 ENCOUNTER — Encounter: Payer: Self-pay | Admitting: Family Medicine

## 2014-12-15 VITALS — BP 119/82 | HR 96 | Temp 98.3°F | Ht 63.0 in | Wt 200.3 lb

## 2014-12-15 DIAGNOSIS — R87619 Unspecified abnormal cytological findings in specimens from cervix uteri: Secondary | ICD-10-CM | POA: Insufficient documentation

## 2014-12-15 DIAGNOSIS — R87613 High grade squamous intraepithelial lesion on cytologic smear of cervix (HGSIL): Secondary | ICD-10-CM

## 2014-12-15 DIAGNOSIS — Z3202 Encounter for pregnancy test, result negative: Secondary | ICD-10-CM | POA: Diagnosis present

## 2014-12-15 LAB — POCT PREGNANCY, URINE: Preg Test, Ur: NEGATIVE

## 2014-12-15 NOTE — Progress Notes (Signed)
Patient ID: Melinda Lawrence, female   DOB: 03/29/1980, 34 y.o.   MRN: [id number]   Pt. Is an 34 y.o. 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 She appears well-developed and well-nourished.  HENT:  Head: Normocephalic and atraumatic.  Eyes: Conjunctivae are normal.  Neck: Normal range of motion. Neck supple.  Cardiovascular: Normal rate and intact distal pulses.   Pulmonary/Chest: Effort normal.  Abdominal: Soft. There is no tenderness.  Genitourinary:    Musculoskeletal: Normal range of motion.  Neurological: She is alert and oriented to person, place, and time.  Skin: Skin is warm and dry. No erythema.  Psychiatric: She has a normal mood and affect.  Nursing note and vitals reviewed.   Patient given informed consent, signed copy in the chart, time out was performed.  Placed in lithotomy position. Cervix viewed with speculum and colposcope after application of acetic acid.   Colposcopy adequate?  yes Acetowhite lesions?Located at 5 o'clock Punctation?none Mosaicism?  none Abnormal vasculature?  none Biopsies?yes ECC?yes  COMMENTS: Patient was given post procedure instructions.  She will be called with results  Federico FlakeKimberly Niles Newton, MD

## 2014-12-20 NOTE — Telephone Encounter (Signed)
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 Per ACCP guidelines patient needs excisional or ablation of T-zone.  We discussed cryotherapy vs LEEP over the phone. Patient is interested in LEEP.   The patient was given the opportunity to ask questions. I discussed that I will have our clinic call her to schedule the procedure.   Federico FlakeKimberly Niles Anina Schnake, MD

## 2014-12-31 ENCOUNTER — Encounter: Payer: Self-pay | Admitting: Obstetrics & Gynecology

## 2015-01-07 ENCOUNTER — Ambulatory Visit (HOSPITAL_COMMUNITY): Payer: Self-pay | Admitting: Psychiatry

## 2015-01-14 ENCOUNTER — Other Ambulatory Visit (HOSPITAL_COMMUNITY)
Admission: RE | Admit: 2015-01-14 | Discharge: 2015-01-14 | Disposition: A | Discharge status: Home / Self Care | Payer: Medicaid Other | Source: Ambulatory Visit | Attending: Obstetrics & Gynecology | Admitting: Obstetrics & Gynecology

## 2015-01-14 ENCOUNTER — Encounter: Payer: Self-pay | Admitting: Obstetrics & Gynecology

## 2015-01-14 ENCOUNTER — Ambulatory Visit (INDEPENDENT_AMBULATORY_CARE_PROVIDER_SITE_OTHER): Payer: Medicaid Other | Admitting: Obstetrics & Gynecology

## 2015-01-14 VITALS — BP 116/78 | HR 85 | Ht 62.75 in | Wt 206.0 lb

## 2015-01-14 DIAGNOSIS — Z3202 Encounter for pregnancy test, result negative: Secondary | ICD-10-CM | POA: Diagnosis present

## 2015-01-14 DIAGNOSIS — R87619 Unspecified abnormal cytological findings in specimens from cervix uteri: Secondary | ICD-10-CM | POA: Insufficient documentation

## 2015-01-14 DIAGNOSIS — R87613 High grade squamous intraepithelial lesion on cytologic smear of cervix (HGSIL): Secondary | ICD-10-CM | POA: Diagnosis present

## 2015-01-14 LAB — POCT PREGNANCY, URINE: PREG TEST UR: NEGATIVE

## 2015-01-14 NOTE — Progress Notes (Signed)
[id number] No LMP recorded (lmp unknown). HSIL pap, Biopsy CIN 2 on 12/15/14, here for LEEP  Patient identified, informed consent obtained, signed copy in chart, time out performed.  Pap smear and colposcopy reviewed.   Pap HSIL Colpo Biopsy CIN 2 ECC negative Teflon coated speculum with smoke evacuator placed.  Cervix visualized. Paracervical block placed.  Small fisher loop used to remove cone of cervix using blend of cut and cautery on LEEP machine.  Edges/Base cauterized with Ball.  Monsel's solution used for hemostasis.  Patient tolerated procedure well.  Patient given post procedure instructions.  Follow up in 12 months for repeat pap or as needed.  Adam PhenixJames G Arnold, MD 01/14/2015

## 2015-01-25 ENCOUNTER — Telehealth: Payer: Self-pay

## 2015-01-25 ENCOUNTER — Telehealth (HOSPITAL_COMMUNITY): Payer: Self-pay

## 2015-01-25 NOTE — Telephone Encounter (Signed)
Pt stated that she does not understand her LEEP results from MyChart.  Could someone please call her back?

## 2015-01-25 NOTE — Telephone Encounter (Signed)
Medication refill request - Message left for pt. this nurse received her message she is in need of a refill of her Tegretol XR last provided 12/08/14.  Pt. last seen 10/07/14, no showed 11/01/14 and cancelled 01/07/15.  Pt. rescheduled self for 02/08/15 but stated on message she is now out of Tegretol and requests Dr. Lolly MustacheArfeen refill until she returns 02/08/15.  Patient last provided Wellbutrin and Trazodone orders for one time on 09/22/14.

## 2015-01-26 NOTE — Telephone Encounter (Signed)
Patient needs to be seen.  She has multiple no shows.

## 2015-01-28 NOTE — Telephone Encounter (Signed)
Pt informed of results.

## 2015-02-01 ENCOUNTER — Telehealth: Payer: Self-pay

## 2015-02-01 NOTE — Telephone Encounter (Signed)
Pt left message on the nurse line stating she wanted to talk with a nurse concerning her results. Please call patient at [phone]

## 2015-02-08 ENCOUNTER — Ambulatory Visit (HOSPITAL_COMMUNITY): Payer: Self-pay | Admitting: Psychiatry

## 2015-02-10 NOTE — Telephone Encounter (Signed)
Attempted to return patient's call. Left voice mail stating we were returning her call regarding her request for test results, [please return our call at the clinic.

## 2015-02-14 NOTE — Telephone Encounter (Signed)
Called pt and left message that I am calling with second attempt to reach her regarding her questions about test results. If she still has questions, please call back and leave a new message stating her questions and if it is ok to leave a detailed response on her voice mail.

## 2015-07-13 ENCOUNTER — Encounter: Payer: Self-pay | Admitting: Obstetrics & Gynecology

## 2015-07-13 ENCOUNTER — Other Ambulatory Visit (HOSPITAL_COMMUNITY)
Admission: RE | Admit: 2015-07-13 | Discharge: 2015-07-13 | Disposition: A | Discharge status: Home / Self Care | Payer: Medicaid Other | Source: Ambulatory Visit | Attending: Obstetrics & Gynecology | Admitting: Obstetrics & Gynecology

## 2015-07-13 ENCOUNTER — Ambulatory Visit (INDEPENDENT_AMBULATORY_CARE_PROVIDER_SITE_OTHER): Payer: Medicaid Other | Admitting: Obstetrics & Gynecology

## 2015-07-13 VITALS — BP 118/81 | HR 126 | Wt 210.6 lb

## 2015-07-13 DIAGNOSIS — Z01411 Encounter for gynecological examination (general) (routine) with abnormal findings: Secondary | ICD-10-CM | POA: Insufficient documentation

## 2015-07-13 DIAGNOSIS — Z124 Encounter for screening for malignant neoplasm of cervix: Secondary | ICD-10-CM

## 2015-07-13 DIAGNOSIS — Z1151 Encounter for screening for human papillomavirus (HPV): Secondary | ICD-10-CM

## 2015-07-13 DIAGNOSIS — D069 Carcinoma in situ of cervix, unspecified: Secondary | ICD-10-CM

## 2015-07-13 NOTE — Patient Instructions (Signed)
Cervical Dysplasia  Cervical dysplasia is a condition in which a woman has abnormal changes in the cells of her cervix. The cervix is the opening to the uterus (womb). It is located between the vagina and the uterus. Cervical dysplasia may be the first sign of cervical cancer.   With early detection, treatment, and close follow-up care, nearly all cases of cervical dysplasia can be cured. If left untreated, dysplasia may become more severe.   CAUSES   Cervical dysplasia can be caused by a human papillomavirus (HPV) infection.  RISK FACTORS   · Having had a sexually transmitted disease, such as chlamydia or a human papillomavirus (HPV) infection.    · Becoming sexually active before age 18.    · Having had more than one sexual partner.    · Not using protection during sexual intercourse, especially with new sexual partners.    · Having had cancer of the vagina or vulva.    · Having a sexual partner whose previous partner had cancer of the cervix or cervical dysplasia.    · Having a sexual partner who has or has had cancer of the penis.    · Having a weakened immune system (such as from having HIV or an organ transplant).    · Being the daughter of a woman who took diethylstilbestrol (DES) during pregnancy.    · Having a family history of cervical cancer.    · Smoking.  SIGNS AND SYMPTOMS   There are usually no symptoms. If there are symptoms, they may include:   · Abnormal vaginal discharge.    · Bleeding between periods or after intercourse.    · Bleeding during menopause.    · Pain during sexual intercourse (dyspareunia).  DIAGNOSIS   A test called a Pap test may be done. During this test, cells are taken from the cervix and then looked at under a microscope. A test in which tissue is removed from the cervix (biopsy) may also be done if the Pap test is abnormal or if the cervix looks abnormal.    TREATMENT   Treatment varies based on the severity of the cervical dysplasia. Treatment may include:  · Cryotherapy.  During cryotherapy, the abnormal cells are frozen with a steel-tip instrument.    · A procedure to remove abnormal tissue from the cervix.  · Surgery to remove abnormal tissue. This is usually done in serious cases of cervical dysplasia. Surgical options include:    A cone biopsy. This is a procedure in which the cervical canal and a portion of the center of the cervix are removed.      Hysterectomy. This is a surgery in which the uterus and cervix are removed.  HOME CARE INSTRUCTIONS   · Only take over-the-counter or prescription medicines for pain or discomfort as directed by your health care provider.    · Do not use tampons, have sexual intercourse, or douche until your health care provider says it is okay.  · Keep follow-up appointments as directed by your health care provider. Women who have been treated for cervical dysplasia should have regular pelvic exams and Pap tests. During the first year following treatment of cervical dysplasia, Pap tests should be done every 3-4 months. In the second year, they should be done every 6 months or as recommended by your health care provider.  · To prevent the condition from developing again, practice safe sex.  SEEK MEDICAL CARE IF:   You develop genital warts.    SEEK IMMEDIATE MEDICAL CARE IF:   · Your menstrual period is heavier than normal.    · You develop   bright red bleeding, especially if you have blood clots.    · You have a fever.    · You have increasing cramps or pain not relieved with medicine.    · You are light-headed, unusually weak, or have fainting spells.    · You have abnormal vaginal discharge.    · You have abdominal pain.      This information is not intended to replace advice given to you by your health care provider. Make sure you discuss any questions you have with your health care provider.     Document Released: 01/08/2005 Document Revised: 01/13/2013 Document Reviewed: 09/03/2012  Elsevier Interactive Patient Education ©2016 [employer]

## 2015-07-13 NOTE — Progress Notes (Signed)
Subjective:     Patient ID: Melinda Lawrence, female   DOB: 05/29/1980, 35 y.o.   MRN: [id number] Cc: f/u pap after colposcopy HPI [id number] No LMP recorded. 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  Past Medical History  Diagnosis Date  . Thyroid disease   . Anemia   . Ovarian cyst   . Anxiety   . Depression   . Back pain 04/27/14    Will be having a steroid injection on 05/11/14.  . Vaginal Pap smear, abnormal    Past Surgical History  Procedure Laterality Date  . External ear surgery     Allergies  Allergen Reactions  . Lamictal [Lamotrigine] Rash   Current Outpatient Prescriptions on File Prior to Visit  Medication Sig Dispense Refill  . buPROPion (WELLBUTRIN XL) 300 MG 24 hr tablet Take 1 tablet (300 mg total) by mouth daily. Take 300 mg daily 30 tablet 0  . carbamazepine (TEGRETOL XR) 200 MG 12 hr tablet TAKE 1 TABLET (200 MG TOTAL) BY MOUTH 2 (TWO) TIMES DAILY. 60 tablet 0  . NUVARING 0.12-0.015 MG/24HR vaginal ring     . traZODone (DESYREL) 100 MG tablet Take 1 tablet (100 mg total) by mouth at bedtime. 30 tablet 0  . clonazePAM (KLONOPIN) 1 MG tablet Take 1 mg by mouth 2 (two) times daily. Reported on 07/13/2015    . omeprazole (PRILOSEC OTC) 20 MG tablet Take 20 mg by mouth.    . pregabalin (LYRICA) 75 MG capsule Take 75 mg by mouth 2 (two) times daily.     No current facility-administered medications on file prior to visit.      Review of Systems  Constitutional: Negative.   Gastrointestinal: Negative.   Genitourinary: Negative.        Objective:   Physical Exam  Constitutional: She appears well-developed. No distress.  Pulmonary/Chest: Effort normal.  Genitourinary: Vagina normal. No vaginal discharge found.  Cervix is well healed, pap was done  Skin: Skin is warm and dry.  Psychiatric: She has a normal mood and affect. Her behavior is normal.       Assessment:     H/o CIN 3 with focally positive margin     Plan:     If  normal pap will repeat in 12 months  Adam PhenixJames G Kyara Boxer, MD 07/13/2015

## 2015-07-15 LAB — CYTOLOGY - PAP

## 2015-07-25 ENCOUNTER — Encounter: Payer: Self-pay | Admitting: *Deleted

## 2016-02-17 ENCOUNTER — Emergency Department (HOSPITAL_COMMUNITY)
Admission: EM | Admit: 2016-02-17 | Discharge: 2016-02-17 | Disposition: A | Discharge status: Home / Self Care | Payer: 59 | Attending: Emergency Medicine | Admitting: Emergency Medicine

## 2016-02-17 ENCOUNTER — Encounter (HOSPITAL_COMMUNITY): Payer: Self-pay | Admitting: *Deleted

## 2016-02-17 ENCOUNTER — Emergency Department (HOSPITAL_COMMUNITY): Payer: 59

## 2016-02-17 DIAGNOSIS — R079 Chest pain, unspecified: Secondary | ICD-10-CM

## 2016-02-17 DIAGNOSIS — F17210 Nicotine dependence, cigarettes, uncomplicated: Secondary | ICD-10-CM | POA: Insufficient documentation

## 2016-02-17 DIAGNOSIS — R0789 Other chest pain: Secondary | ICD-10-CM | POA: Insufficient documentation

## 2016-02-17 LAB — BASIC METABOLIC PANEL
Anion gap: 10 (ref 5–15)
BUN: 7 mg/dL (ref 6–20)
CO2: 26 mmol/L (ref 22–32)
Calcium: 9.9 mg/dL (ref 8.9–10.3)
Chloride: 102 mmol/L (ref 101–111)
Creatinine, Ser: 0.8 mg/dL (ref 0.44–1.00)
GFR calc Af Amer: >60 mL/min (ref >60)
GFR calc non Af Amer: >60 mL/min (ref >60)
Glucose, Bld: 72 mg/dL (ref 65–99)
Potassium: 4.5 mmol/L (ref 3.5–5.1)
Sodium: 138 mmol/L (ref 135–145)

## 2016-02-17 LAB — CBC
HCT: 38 % (ref 36.0–46.0)
Hemoglobin: 12.9 g/dL (ref 12.0–15.0)
MCH: 28.9 pg (ref 26.0–34.0)
MCHC: 33.9 g/dL (ref 30.0–36.0)
MCV: 85 fL (ref 78.0–100.0)
Platelets: 447 10*3/uL — ABNORMAL HIGH (ref 150–400)
RBC: 4.47 MIL/uL (ref 3.87–5.11)
RDW: 14.1 % (ref 11.5–15.5)
WBC: 13.8 10*3/uL — ABNORMAL HIGH (ref 4.0–10.5)

## 2016-02-17 LAB — I-STAT BETA HCG BLOOD, ED (MC, WL, AP ONLY): I-stat hCG, quantitative: <5 m[IU]/mL (ref <5)

## 2016-02-17 LAB — I-STAT TROPONIN, ED: Troponin i, poc: 0 ng/mL (ref 0.00–0.08)

## 2016-02-17 MED ORDER — LIDOCAINE VISCOUS 2 % MT SOLN
15.0000 mL | Freq: Once | OROMUCOSAL | Status: AC
  Administered 2016-02-17: 15 mL via OROMUCOSAL
  Filled 2016-02-17: qty 15

## 2016-02-17 NOTE — ED Provider Notes (Signed)
[facility]-EMERGENCY DEPT Provider Note   CSN: [id number] Arrival date & time: 02/17/16  1336     History   Chief Complaint Chief Complaint  Patient presents with  . Chest Pain    HPI Melinda Lawrence is a 36 y.o. female.  HPI Patient is a [age] showed African-American female with a history of anxiety, depression and thyroid disease who presents with chest pain. Chest pain started around 2 this afternoon. It is midsternal with radiation to the back. There is no associated nausea or vomiting. There is no associated diaphoresis. There is minimal shortness of breath, chest pain starts. Currently the patient is asymptomatic without pain. She said the pain has come and went. She has had similar episodes occurring over the past several months that of resolved on her own. She has not sought treatment for these additional episodes. Today she denies nausea or vomiting. She denies abdominal pain. She denies diarrhea or constipation. She denies polyuria or dysuria. She has no additional acute complaints or concerns. Past Medical History:  Diagnosis Date  . Anemia   . Anxiety   . Back pain 04/27/14   Will be having a steroid injection on 05/11/14.  . Depression   . Ovarian cyst   . Thyroid disease   . Vaginal Pap smear, abnormal     Patient Active Problem List   Diagnosis Date Noted  . Severe dysplasia of cervix (CIN III) 07/13/2015  . Abnormal Pap smear of cervix 12/15/2014  . Panic disorder without agoraphobia 03/25/2014  . Major depression, recurrent (HCC) 03/02/2014  . Low grade squamous intraepithelial lesion (LGSIL) on Papanicolaou smear of cervix 06/19/2013    Past Surgical History:  Procedure Laterality Date  . EXTERNAL EAR SURGERY      OB History    Gravida Para Term Preterm AB Living   5 2 2   3 2    SAB TAB Ectopic Multiple Live Births     3     2       Home Medications    Prior to Admission medications   Medication Sig Start Date End Date Taking? Authorizing Provider   buPROPion (WELLBUTRIN XL) 300 MG 24 hr tablet Take 1 tablet (300 mg total) by mouth daily. Take 300 mg daily 09/22/14   Cleotis NipperSyed T Arfeen, MD  carbamazepine (TEGRETOL XR) 200 MG 12 hr tablet TAKE 1 TABLET (200 MG TOTAL) BY MOUTH 2 (TWO) TIMES DAILY. 12/08/14   Benjaman PottGerald D Halsey Persaud, MD  clonazePAM (KLONOPIN) 1 MG tablet Take 1 mg by mouth 2 (two) times daily. Reported on 07/13/2015    Historical Provider, MD  gabapentin (NEURONTIN) 300 MG capsule Take by mouth.    Historical Provider, MD  NUVARING 0.12-0.015 MG/24HR vaginal ring  05/19/13   Historical Provider, MD  omeprazole (PRILOSEC OTC) 20 MG tablet Take 20 mg by mouth. 02/24/14 02/24/15  Historical Provider, MD  pregabalin (LYRICA) 75 MG capsule Take 75 mg by mouth 2 (two) times daily.    Historical Provider, MD  traZODone (DESYREL) 100 MG tablet Take 1 tablet (100 mg total) by mouth at bedtime. 09/22/14   Cleotis NipperSyed T Arfeen, MD    Family History Family History  Problem Relation Age of Onset  . Cancer Mother   . Diabetes Father   . Depression Maternal Grandmother     Social History Social History  Substance Use Topics  . Smoking status: Current Every Day Smoker    Packs/day: 0.25    Types: Cigarettes  . Smokeless tobacco:  Never Used  . Alcohol use No     Allergies   Lamictal [lamotrigine]   Review of Systems Review of Systems   Physical Exam Updated Vital Signs BP 103/67 (BP Location: Right Arm)   Pulse 96   Temp 98.5 F (36.9 C) (Oral)   Resp 15   SpO2 96%   Physical Exam  Constitutional: She is oriented to person, place, and time. She appears well-developed and well-nourished.  HENT:  Head: Normocephalic and atraumatic.  Cardiovascular: Normal rate and regular rhythm.  Exam reveals no gallop and no friction rub.   No murmur heard. Pulmonary/Chest: Effort normal and breath sounds normal. No respiratory distress. She has no rales.  Abdominal: Soft. Bowel sounds are normal. She exhibits no distension. There is no tenderness.    Musculoskeletal: She exhibits no edema.  Neurological: She is alert and oriented to person, place, and time.     ED Treatments / Results  Labs (all labs ordered are listed, but only abnormal results are displayed) Labs Reviewed  CBC - Abnormal; Notable for the following:       Result Value   WBC 13.8 (*)    Platelets 447 (*)    All other components within normal limits  BASIC METABOLIC PANEL  I-STAT TROPOININ, ED  I-STAT BETA HCG BLOOD, ED ([facility], [facility], [facility] ONLY)    EKG  EKG Interpretation  Date/Time:  Friday February 17 2016 13:45:52 EST Ventricular Rate:  93 PR Interval:  146 QRS Duration: 72 QT Interval:  348 QTC Calculation: 432 R Axis:   59 Text Interpretation:  Normal sinus rhythm Non-specific ST-t changes No old tracing to compare Confirmed by KOHUT  MD, STEPHEN [phone]) on 02/17/2016 4:18:29 PM       Radiology Dg Chest 2 View  Result Date: 02/17/2016 CLINICAL DATA:  Chest pain for 2 months with shortness of Breath EXAM: CHEST  2 VIEW COMPARISON:  None. FINDINGS: The heart size and mediastinal contours are within normal limits. Both lungs are clear. Calcified granuloma is noted in the left mid lung. The visualized skeletal structures are unremarkable. IMPRESSION: No active cardiopulmonary disease. Electronically Signed   By: Alcide Clever M.D.   On: 02/17/2016 14:25    Procedures Procedures (including critical care time)  Medications Ordered in ED Medications  lidocaine (XYLOCAINE) 2 % viscous mouth solution 15 mL (not administered)     Initial Impression / Assessment and Plan / ED Course  I have reviewed the triage vital signs and the nursing notes.  Pertinent labs & imaging results that were available during my care of the patient were reviewed by me and considered in my medical decision making (see chart for details).  The patient presents with chest pain that started around 2 this afternoon. It is substernal with radiation to the back. It is intermittent and  not constant. She is currently asymptomatic. She had no associated pressure, nausea, vomiting, abdominal pain or diaphoresis. Initial laboratory evaluation was unremarkable. I-STAT troponin negative. EKG without ST segment elevation. The differential diagnosis for a patient presenting with chest pain would include ACS, pulmonary embolus, gastroesophageal reflux disease, muscular skeletal pain or other etiologies. However, given that the patient is not tachycardic, tachypneic or short of breath I do not think pulmonary embolism is likely at this time. Additionally, given the patient's normal EKG and negative i-STAT troponin I do not think her chest pain is secondary to acute coronary syndrome. I think the most likely etiology of her pain is either secondary to  anxiety given her extensive psychiatric comorbidities, gastroesophageal reflux disease or musculoskeletal in etiology. Also, given the chronicity of her symptoms she may have some component of esophageal dysmotility or spasm. This would be appropriate to be worked up in the outpatient setting. I do not think she needs further cardiac workup currently at this time. I discussed with her the importance of discussing these issues with her primary care physician for ongoing management in the outpatient setting. Again, she has had intermittent symptoms of similar nature occurring over the previous several months. Currently, she is afebrile and hemodynamically stable.  Final Clinical Impressions(s) / ED Diagnoses   Final diagnoses:  Nonspecific chest pain    New Prescriptions New Prescriptions   No medications on file     Thomasene Lot, MD 02/17/16 1635    Raeford Razor, MD 02/29/16 [phone]

## 2016-02-17 NOTE — Discharge Instructions (Signed)
I do not think your chest pain is coming from your heart.  I would advise you follow up with your primary care physician for ongoing management. Since you have had symptoms coming and going for several months it will be very important that you make an appointment to see your regular doctor to discuss these symptoms with him/her. They will be able to help further decide what is causing you to have these pains.  As with all chest pain, if your pain worsens or returns or if you develop shortness of breath, nausea, vomiting or sweating associated with this pain and it remains persistent please call your doctor or return to the emergency department immediately.

## 2016-02-17 NOTE — ED Triage Notes (Signed)
Pt reports episode of mid "twisting" chest pain today that radiates through to her back. Reports similar episodes in past. Pt took ASA and ems gave 1 nitro pta, pt is now pain free but reports feeling tired. ekg done and airway intact at triage.

## 2017-06-22 IMAGING — DX DG CHEST 2V
2 series · 2 of 2 positions shown · non-contrast
Comparison: None.

CLINICAL DATA: Chest pain for 2 months with shortness of Breath

EXAM:
CHEST  2 VIEW

[chest pa]
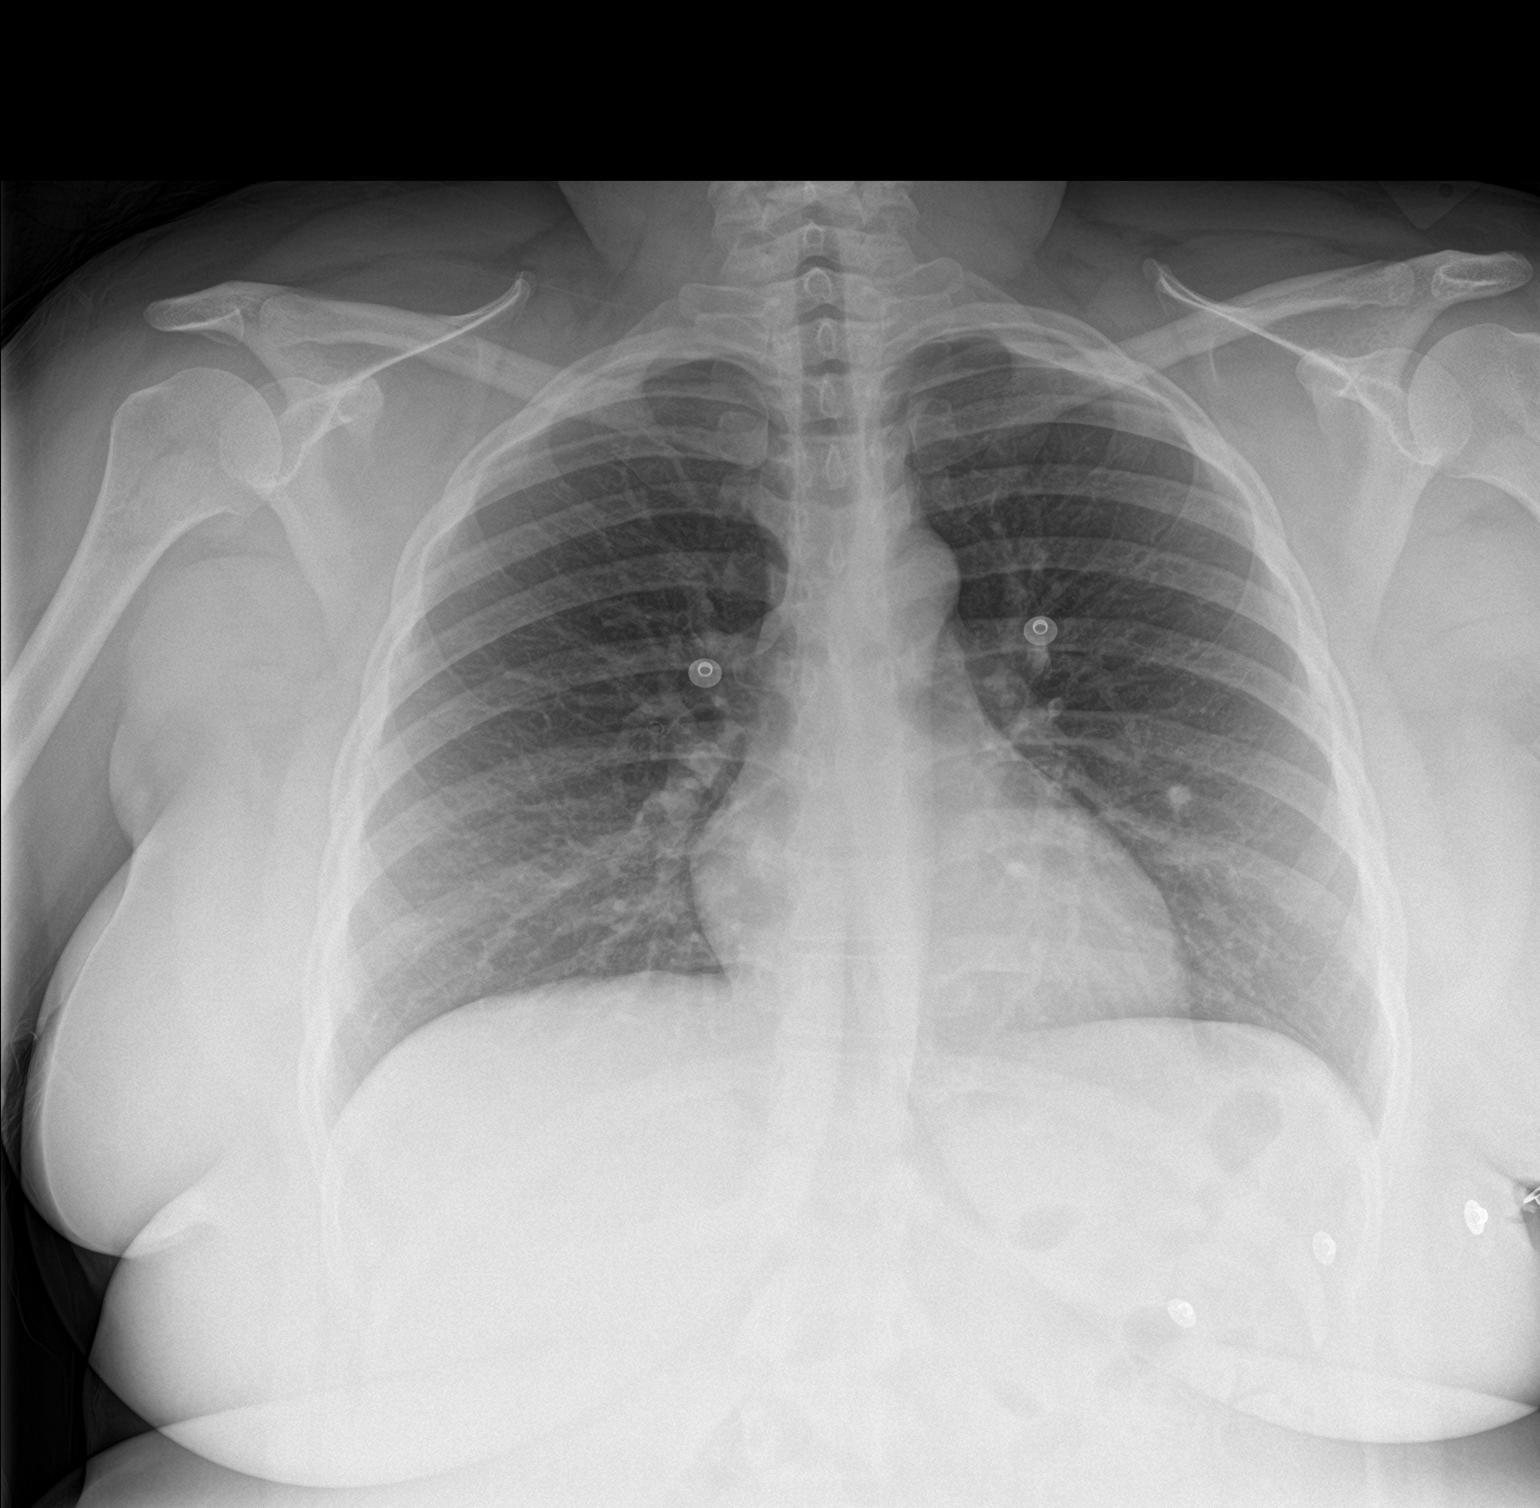

[chest lat]
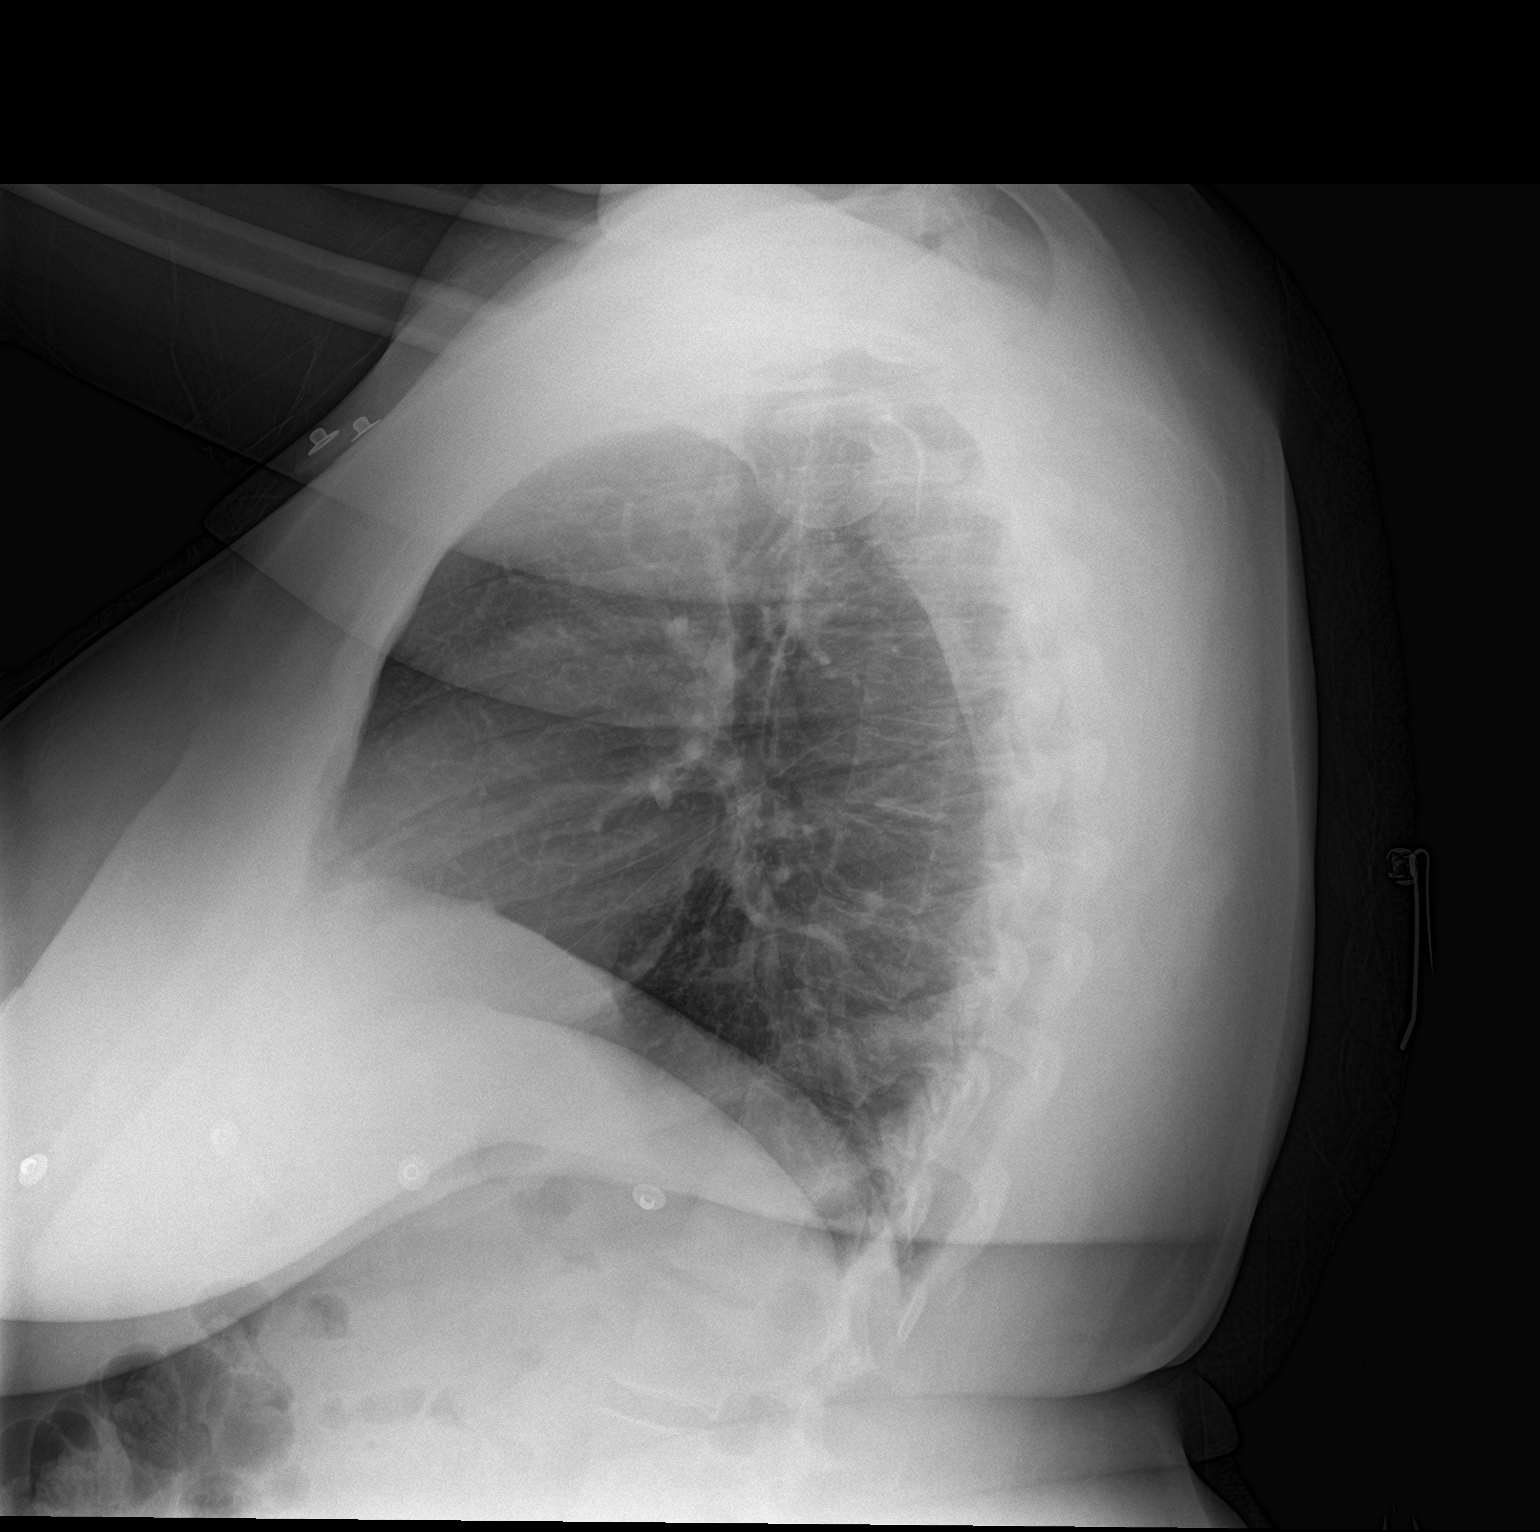

[2 of 2 positions shown; findings below may reference images not displayed]

FINDINGS: The heart size and mediastinal contours are within normal limits.
Both lungs are clear. Calcified granuloma is noted in the left mid
lung. The visualized skeletal structures are unremarkable.
IMPRESSION: No active cardiopulmonary disease.

## 2022-12-04 DIAGNOSIS — F331 Major depressive disorder, recurrent, moderate: Secondary | ICD-10-CM | POA: Diagnosis not present

## 2023-01-07 DIAGNOSIS — F331 Major depressive disorder, recurrent, moderate: Secondary | ICD-10-CM | POA: Diagnosis not present

## 2023-02-04 DIAGNOSIS — F4323 Adjustment disorder with mixed anxiety and depressed mood: Secondary | ICD-10-CM | POA: Diagnosis not present

## 2023-02-07 DIAGNOSIS — F329 Major depressive disorder, single episode, unspecified: Secondary | ICD-10-CM | POA: Diagnosis not present

## 2023-02-07 DIAGNOSIS — F411 Generalized anxiety disorder: Secondary | ICD-10-CM | POA: Diagnosis not present

## 2023-02-19 DIAGNOSIS — F4323 Adjustment disorder with mixed anxiety and depressed mood: Secondary | ICD-10-CM | POA: Diagnosis not present

## 2023-10-08 ENCOUNTER — Other Ambulatory Visit: Payer: Self-pay | Admitting: Medical Genetics

## 2023-10-14 ENCOUNTER — Other Ambulatory Visit (HOSPITAL_COMMUNITY)

## 2023-12-31 ENCOUNTER — Other Ambulatory Visit (HOSPITAL_COMMUNITY)
